# Patient Record
Sex: Male | Born: 1950
Health system: Southern US, Community
[De-identification: ages and names within clinical notes are randomized; demographics above are authoritative.]

## PROBLEM LIST (undated history)

## (undated) DIAGNOSIS — Z87898 Personal history of other specified conditions: Secondary | ICD-10-CM

## (undated) DIAGNOSIS — B019 Varicella without complication: Secondary | ICD-10-CM

## (undated) DIAGNOSIS — N400 Enlarged prostate without lower urinary tract symptoms: Secondary | ICD-10-CM

## (undated) DIAGNOSIS — K921 Melena: Secondary | ICD-10-CM

## (undated) DIAGNOSIS — M1990 Unspecified osteoarthritis, unspecified site: Secondary | ICD-10-CM

## (undated) DIAGNOSIS — R7303 Prediabetes: Secondary | ICD-10-CM

## (undated) DIAGNOSIS — N138 Other obstructive and reflux uropathy: Secondary | ICD-10-CM

## (undated) DIAGNOSIS — I10 Essential (primary) hypertension: Secondary | ICD-10-CM

## (undated) DIAGNOSIS — Z972 Presence of dental prosthetic device (complete) (partial): Secondary | ICD-10-CM

## (undated) DIAGNOSIS — E782 Mixed hyperlipidemia: Secondary | ICD-10-CM

## (undated) HISTORY — DX: Varicella without complication: B01.9

## (undated) HISTORY — DX: Benign prostatic hyperplasia without lower urinary tract symptoms: N40.0

## (undated) HISTORY — DX: Melena: K92.1

---

## 1999-05-31 ENCOUNTER — Other Ambulatory Visit: Admission: RE | Admit: 1999-05-31 | Discharge: 1999-05-31 | Payer: Self-pay | Admitting: Urology

## 2003-08-16 DIAGNOSIS — Z87438 Personal history of other diseases of male genital organs: Secondary | ICD-10-CM

## 2003-08-16 HISTORY — DX: Personal history of other diseases of male genital organs: Z87.438

## 2003-11-14 HISTORY — PX: PROSTATE SURGERY: SHX751

## 2004-02-21 ENCOUNTER — Emergency Department (HOSPITAL_COMMUNITY): Admission: EM | Admit: 2004-02-21 | Discharge: 2004-02-21 | Payer: Self-pay

## 2004-04-21 ENCOUNTER — Encounter (INDEPENDENT_AMBULATORY_CARE_PROVIDER_SITE_OTHER): Payer: Self-pay | Admitting: *Deleted

## 2004-04-22 ENCOUNTER — Inpatient Hospital Stay (HOSPITAL_COMMUNITY): Admission: RE | Admit: 2004-04-22 | Discharge: 2004-04-24 | Payer: Self-pay | Admitting: Urology

## 2004-04-22 HISTORY — PX: PROSTATECTOMY: SHX69

## 2005-08-15 HISTORY — PX: PROSTATE SURGERY: SHX751

## 2006-03-15 ENCOUNTER — Ambulatory Visit: Payer: Self-pay | Admitting: Internal Medicine

## 2006-08-11 ENCOUNTER — Ambulatory Visit: Payer: Self-pay | Admitting: Internal Medicine

## 2006-08-14 ENCOUNTER — Ambulatory Visit: Payer: Self-pay | Admitting: Internal Medicine

## 2007-08-28 ENCOUNTER — Ambulatory Visit: Payer: Self-pay | Admitting: Internal Medicine

## 2007-08-28 DIAGNOSIS — M542 Cervicalgia: Secondary | ICD-10-CM | POA: Insufficient documentation

## 2010-12-31 NOTE — Op Note (Signed)
NAME:  John Stone, John Stone                         ACCOUNT NO.:  [id number]   MEDICAL RECORD NO.:  [id number]                   PATIENT TYPE:  INP   LOCATION:  0377                                 FACILITY:  [facility]   PHYSICIAN:  Sigmund I. Patsi Sears, M.D.         DATE OF BIRTH:  Oct 26, 1950   DATE OF PROCEDURE:  04/22/2004  DATE OF DISCHARGE:  04/24/2004                                 OPERATIVE REPORT   PREOPERATIVE DIAGNOSES:  1.  Benign prostatic hypertrophy.  2.  Bladder outlet obstruction.   POSTOPERATIVE DIAGNOSES:  1.  Benign prostatic hypertrophy.  2.  Bladder outlet obstruction.   PROCEDURE PERFORMED:  Open simple prostatectomy.   SURGEON:  Sigmund I. Patsi Sears, M.D.   RESIDENT SURGEON:  Thyra Breed, M.D.   ANESTHESIA:  General endotracheal.   ESTIMATED BLOOD LOSS:  500 cc.   DRAINS:  An 41 French Foley catheter to straight drain.  A 10 [place] Blake  to bulb suction.   COMPLICATIONS:  None.   INDICATIONS FOR PROCEDURE:  Mr. Lepkowski is a pleasant 60 year old male with a  history of BPH as well as chronic urinary retention.  He underwent a TUNA in  April, 2005, at which time transrectal ultrasound measured his gland at 100  cc.  Recently, he has returned because of continued problems of urinary  retention and is now self-catheterizing once a day with continued elevated  post-void residuals.  Given the fact that he has failed TUNA therapy in the  past, in addition to a 100 cc gland, we have recommended that Mr. Lanz  undergo a simple prostatectomy for definitive surgical management of his  chronic urinary retention.  Mr. Rorke understands the risks, benefits and  alternatives of the procedure, including bleeding, infection, damage to  adjacent structures, incontinence as well as mortality.  Informed consent  has been obtained.   PROCEDURE IN DETAIL:  Following identification by his arm bracelet, the  patient was brought to the operating room and placed in the supine  position.  Here, he received preoperative IV antibiotics and underwent successful  induction of general endotracheal anesthesia.  His lower abdomen was then  shaved, prepped with Betadine, and draped in the usual sterile fashion on  the field.  An 84 French Foley catheter was placed, and the bladder was  drained.  We created an infraumbilical midline incision from just below the  umbilicus to the level of the pubis.  The Bovie electrocautery was used to  carry the incision down through the subcutaneous tissue until the anterior  rectus fascia was encountered.  The Bovie was used to divide the rectus  fascia, exposing the two bellies of the rectus abdominus muscles.  These  were bluntly divided, and the midline was isolated.  The transversalis  fascia was entered, and both lateral aspects of the prostate were developed  bluntly.  We then placed the Bookwalter self-retaining retractor.  The  Stamey retractor blade for  the Omni-Tract was used to provide cephalad  traction on the bladder, allowing excellent exposure of the prostate.  The  pelvis was rather deep, and the prostate was located just beneath the pubis.  The superficial veins overlying the prostate and bladder were then divided  using Bovie electrocautery.  Using a sponge stick and a Kitner, we then  cleaned the capsule overlying the prostate.  Upon palpation, the prostate  did appear to seem like a 75-100 cc gland.  Once the capsule was adequately  exposed, we placed two stay sutures on either end of the capsule  horizontally of 2-0 Vicryl.  A long-handle knife was used to incise the  capsule.  Using Metzenbaum scissors, we then developed a circular plane  within the incision.  The surgeon's finger was then used to further develop  this plane surrounding the adenoma but staying within the surgical capsule.  We continued this maneuver in a circumferential manner until the entire  adenoma was free within the capsule.  Between the  surgeon's thumb and  forefinger, we then pinched the urethra from the apex of the prostate.  This  allowed the delivery of the entire adenoma from the capsule.  There was some  venous bleeding which continued following removal of the adenoma with the  capsule intact.  Two figure-of-eight 2-0 Vicryl sutures were then used at  the 5 and 8 o'clock positions of the prostatic urethra near the pedicle to  provide excellent hemostasis.  Once the bleeding was adequately controlled,  we then used 2-0 Vicryl in a running fashion to close the capsule and  reapproximate the bladder neck to the urethra.  Prior to closing the  capsule, we placed an 3 French Foley catheter and guided it into the  bladder, 15 cc of sterile fluids were used to fill the balloon.  The Foley  catheter was then irrigated, and the catheter was seen to be water-tight.  The urine was clear at this point.  We then copiously irrigated the wound.  There was no further evidence of active bleeding.  Through a separate stab  incision of the midline incision, a 10 French flat Blake drain was placed  surrounding the prostatic capsule.  One 1-0 PDS was then used in a running  fashion to close the fascia.  The subcutaneous tissues were then irrigated.  Marcaine 0.5% was used to inject the skin.  Stainless surgical clips were  used to reapproximate the skin.  A 3-0 nylon suture was then used to suture  the drain into place.  The incisions were cleaned and dried, and sterile  dressings applied.  All sponge, needle, and instrument counts were correct  x2.  Dr. Patsi Sears was present and participated in the entire procedure, as  he was the responsible surgeon.  Patient tolerated the procedure well, and  there were no complications.   DISPOSITION:  After awakening from general anesthesia, the patient was  transported to the post anesthesia care unit in stable condition.  From here, he was transferred to the floor for further postoperative  management.     Thyra Breed, MD                            Sigmund I. Patsi Sears, M.D.    EG/MEDQ  D:  04/27/2004  T:  04/27/2004  Job:  [id number]

## 2011-04-01 ENCOUNTER — Ambulatory Visit: Payer: Self-pay | Admitting: Internal Medicine

## 2011-04-01 ENCOUNTER — Encounter: Payer: Self-pay | Admitting: Internal Medicine

## 2011-04-04 ENCOUNTER — Ambulatory Visit: Payer: Self-pay | Admitting: Internal Medicine

## 2015-06-29 ENCOUNTER — Encounter (HOSPITAL_COMMUNITY): Payer: Self-pay | Admitting: Emergency Medicine

## 2015-06-29 ENCOUNTER — Emergency Department (HOSPITAL_COMMUNITY)
Admission: EM | Admit: 2015-06-29 | Discharge: 2015-06-29 | Disposition: A | Discharge status: Home / Self Care | Payer: No Typology Code available for payment source | Attending: Emergency Medicine | Admitting: Emergency Medicine

## 2015-06-29 DIAGNOSIS — M545 Low back pain, unspecified: Secondary | ICD-10-CM

## 2015-06-29 DIAGNOSIS — Y92410 Unspecified street and highway as the place of occurrence of the external cause: Secondary | ICD-10-CM | POA: Insufficient documentation

## 2015-06-29 DIAGNOSIS — S3992XA Unspecified injury of lower back, initial encounter: Secondary | ICD-10-CM | POA: Insufficient documentation

## 2015-06-29 DIAGNOSIS — Y998 Other external cause status: Secondary | ICD-10-CM | POA: Insufficient documentation

## 2015-06-29 DIAGNOSIS — I10 Essential (primary) hypertension: Secondary | ICD-10-CM | POA: Insufficient documentation

## 2015-06-29 DIAGNOSIS — Z79899 Other long term (current) drug therapy: Secondary | ICD-10-CM | POA: Diagnosis not present

## 2015-06-29 DIAGNOSIS — Y9389 Activity, other specified: Secondary | ICD-10-CM | POA: Diagnosis not present

## 2015-06-29 HISTORY — DX: Essential (primary) hypertension: I10

## 2015-06-29 NOTE — Discharge Instructions (Signed)
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
## 2015-06-29 NOTE — ED Notes (Signed)
Patient states that he was in the [facility] on Thursday 11/10. Patient was seen at an urgent care and prescribed a muscle relaxant and high blood pressure medication. Patient wants to be evaluated here because his back still hurts and wants to check out his blood pressure.

## 2015-10-26 ENCOUNTER — Encounter: Payer: Self-pay | Admitting: Adult Health

## 2015-10-26 ENCOUNTER — Ambulatory Visit (INDEPENDENT_AMBULATORY_CARE_PROVIDER_SITE_OTHER): Payer: Medicare Other | Admitting: Adult Health

## 2015-10-26 VITALS — BP 140/90 | Temp 98.2°F | Ht 64.0 in | Wt 192.6 lb

## 2015-10-26 DIAGNOSIS — K625 Hemorrhage of anus and rectum: Secondary | ICD-10-CM | POA: Diagnosis not present

## 2015-10-26 DIAGNOSIS — I10 Essential (primary) hypertension: Secondary | ICD-10-CM

## 2015-10-26 DIAGNOSIS — Z7189 Other specified counseling: Secondary | ICD-10-CM

## 2015-10-26 DIAGNOSIS — Z7689 Persons encountering health services in other specified circumstances: Secondary | ICD-10-CM

## 2015-10-26 NOTE — Progress Notes (Signed)
Patient presents to clinic today to establish care. He is a pleasant AA male who  has a past medical history of Hypertension; Blood in stool; Chicken pox; and BPH (benign prostatic hyperplasia).   Acute Concerns: Establish Care Rectal Bleeding - one month ago he had two days of BRBPR. He noticed when he wiped. Has not had any since and has not had this issue in the past. Denies any pain or itching.   Chronic Issues:  HTN - Slightly elevated today. Denies any symptom of hyper or hypotension.   Health Maintenance: Dental -- Every six months Vision -- Does not see Immunizations -- needs Pneumovax  Colonoscopy -- Never had  Diet: Does not eat healthy Exercise: Does not exercise.    Past Medical History  Diagnosis Date  . Hypertension   . Blood in stool   . Chicken pox     Past Surgical History  Procedure Laterality Date  . Prostate surgery  2007    No current outpatient prescriptions on file prior to visit.   No current facility-administered medications on file prior to visit.    No Known Allergies  Family History  Problem Relation Age of Onset  . Hypertension      parent  . Hypertension      grandparent     Social History   Social History  . Marital Status: Married    Spouse Name: N/A  . Number of Children: N/A  . Years of Education: N/A   Occupational History  . Not on file.   Social History Main Topics  . Smoking status: Never Smoker   . Smokeless tobacco: Not on file  . Alcohol Use: No  . Drug Use: No  . Sexual Activity: Not on file   Other Topics Concern  . Not on file   Social History Narrative   Married   Regular exercise- no    Review of Systems  Constitutional: Negative.   HENT: Negative.   Eyes: Negative.   Respiratory: Negative.   Cardiovascular: Negative.   Gastrointestinal: Negative.   Genitourinary: Negative.   Musculoskeletal: Negative.   Skin: Negative.   Neurological: Negative.   Endo/Heme/Allergies: Negative.      BP 140/90 mmHg  Temp(Src) 98.2 F (36.8 C) (Oral)  Ht 5\' 4"  (1.626 m)  Wt 192 lb 9.6 oz (87.363 kg)  BMI 33.04 kg/m2  Physical Exam  Constitutional: He is oriented to person, place, and time and well-developed, well-nourished, and in no distress. No distress.  HENT:  Head: Normocephalic and atraumatic.  Right Ear: External ear normal.  Left Ear: External ear normal.  Nose: Nose normal.  Mouth/Throat: Oropharynx is clear and moist. No oropharyngeal exudate.  Neck: Normal range of motion. Neck supple. No thyromegaly present.  Cardiovascular: Normal rate, regular rhythm, normal heart sounds and intact distal pulses.  Exam reveals no gallop and no friction rub.   No murmur heard. Pulmonary/Chest: Effort normal and breath sounds normal. No respiratory distress. He has no wheezes. He has no rales. He exhibits no tenderness.  Genitourinary: Rectal exam shows external hemorrhoid. Rectal exam shows no internal hemorrhoid, no fissure, no laceration and no tenderness. Guaiac negative stool.  Lymphadenopathy:    He has no cervical adenopathy.  Neurological: He is alert and oriented to person, place, and time. Gait normal. GCS score is 15.  Skin: Skin is warm and dry. No rash noted. He is not diaphoretic. No erythema. No pallor.  Psychiatric: Mood, memory, affect and judgment normal.  Vitals reviewed.    Assessment/Plan:  1. Encounter to establish care - Follow up for CPE - Work on diet and exercise -   2. Essential hypertension - Will recheck at CPE.  - Adjust medication as needed   3. Rectal bleeding - Likely from hemorrhoid.  - advised better diet and water intake

## 2015-10-26 NOTE — Progress Notes (Signed)
Pre visit review using our clinic review tool, if applicable. No additional management support is needed unless otherwise documented below in the visit note. 

## 2015-10-26 NOTE — Patient Instructions (Signed)
It was great meeting you today   Follow up with me for your physical.   Someone will call you top schedule your colonoscopy.   If you need anything in the meantime, please let me know.

## 2016-01-25 ENCOUNTER — Encounter: Payer: Medicare Other | Admitting: Adult Health

## 2016-03-15 ENCOUNTER — Encounter: Payer: Self-pay | Admitting: Internal Medicine

## 2016-03-15 ENCOUNTER — Ambulatory Visit (INDEPENDENT_AMBULATORY_CARE_PROVIDER_SITE_OTHER): Payer: Medicare Other | Admitting: Adult Health

## 2016-03-15 ENCOUNTER — Encounter: Payer: Self-pay | Admitting: Adult Health

## 2016-03-15 VITALS — BP 154/82 | Temp 98.3°F | Ht 64.0 in | Wt 185.3 lb

## 2016-03-15 DIAGNOSIS — N400 Enlarged prostate without lower urinary tract symptoms: Secondary | ICD-10-CM | POA: Insufficient documentation

## 2016-03-15 DIAGNOSIS — E669 Obesity, unspecified: Secondary | ICD-10-CM

## 2016-03-15 DIAGNOSIS — I10 Essential (primary) hypertension: Secondary | ICD-10-CM | POA: Diagnosis not present

## 2016-03-15 LAB — CBC WITH DIFFERENTIAL/PLATELET
BASOS PCT: 0.5 % (ref 0.0–3.0)
Basophils Absolute: 0 10*3/uL (ref 0.0–0.1)
EOS PCT: 1.5 % (ref 0.0–5.0)
Eosinophils Absolute: 0.1 10*3/uL (ref 0.0–0.7)
HEMATOCRIT: 44.8 % (ref 39.0–52.0)
HEMOGLOBIN: 14.8 g/dL (ref 13.0–17.0)
LYMPHS PCT: 24.2 % (ref 12.0–46.0)
Lymphs Abs: 1.9 10*3/uL (ref 0.7–4.0)
MCHC: 33.1 g/dL (ref 30.0–36.0)
MCV: 84.2 fl (ref 78.0–100.0)
Monocytes Absolute: 0.8 10*3/uL (ref 0.1–1.0)
Monocytes Relative: 9.9 % (ref 3.0–12.0)
NEUTROS ABS: 5.2 10*3/uL (ref 1.4–7.7)
Neutrophils Relative %: 63.9 % (ref 43.0–77.0)
Platelets: 204 10*3/uL (ref 150.0–400.0)
RBC: 5.32 Mil/uL (ref 4.22–5.81)
RDW: 14.7 % (ref 11.5–15.5)
WBC: 8.1 10*3/uL (ref 4.0–10.5)

## 2016-03-15 LAB — POC URINALSYSI DIPSTICK (AUTOMATED)
BILIRUBIN UA: NEGATIVE
Glucose, UA: NEGATIVE
Ketones, UA: NEGATIVE
LEUKOCYTES UA: NEGATIVE
NITRITE UA: NEGATIVE
PH UA: 6
PROTEIN UA: NEGATIVE
RBC UA: NEGATIVE
Spec Grav, UA: 1.025
Urobilinogen, UA: 0.2

## 2016-03-15 LAB — PSA: PSA: 8.28 ng/mL — ABNORMAL HIGH (ref 0.10–4.00)

## 2016-03-15 LAB — BASIC METABOLIC PANEL
BUN: 19 mg/dL (ref 6–23)
CO2: 30 mEq/L (ref 19–32)
CREATININE: 1.16 mg/dL (ref 0.40–1.50)
Calcium: 9.8 mg/dL (ref 8.4–10.5)
Chloride: 100 mEq/L (ref 96–112)
GFR: 81.01 mL/min (ref >60.00)
Glucose, Bld: 80 mg/dL (ref 70–99)
POTASSIUM: 3.7 meq/L (ref 3.5–5.1)
Sodium: 138 mEq/L (ref 135–145)

## 2016-03-15 LAB — HEPATIC FUNCTION PANEL
ALBUMIN: 4.3 g/dL (ref 3.5–5.2)
ALT: 14 U/L (ref 0–53)
AST: 15 U/L (ref 0–37)
Alkaline Phosphatase: 52 U/L (ref 39–117)
Bilirubin, Direct: 0.1 mg/dL (ref 0.0–0.3)
TOTAL PROTEIN: 7.6 g/dL (ref 6.0–8.3)
Total Bilirubin: 0.7 mg/dL (ref 0.2–1.2)

## 2016-03-15 LAB — LIPID PANEL
CHOLESTEROL: 186 mg/dL (ref 0–200)
HDL: 52.9 mg/dL (ref >39.00)
LDL Cholesterol: 120 mg/dL — ABNORMAL HIGH (ref 0–99)
NonHDL: 133.5
TRIGLYCERIDES: 66 mg/dL (ref 0.0–149.0)
Total CHOL/HDL Ratio: 4
VLDL: 13.2 mg/dL (ref 0.0–40.0)

## 2016-03-15 LAB — HEMOGLOBIN A1C: HEMOGLOBIN A1C: 5.8 % (ref 4.6–6.5)

## 2016-03-15 LAB — TSH: TSH: 1.2 u[IU]/mL (ref 0.35–4.50)

## 2016-03-15 MED ORDER — LISINOPRIL-HYDROCHLOROTHIAZIDE 20-25 MG PO TABS: 1.0000 | ORAL_TABLET | Freq: Every day | ORAL | 3 refills | Status: DC

## 2016-03-15 NOTE — Patient Instructions (Addendum)
It was great seeing you today.   I will follow up with you on your labs.   Please work on diet and exercise in order to lose weight and lead a healthy life.   I have changed your blood pressure medication and added a medication called Lisinopril to it.   I would like you to follow up with me in one month so that we can check your blood pressure  Start taking a daily 81mg  aspirin   Health Maintenance, Male A healthy lifestyle and preventative care can promote health and wellness.  Maintain regular health, dental, and eye exams.  Eat a healthy diet. Foods like vegetables, fruits, whole grains, low-fat dairy products, and lean protein foods contain the nutrients you need and are low in calories. Decrease your intake of foods high in solid fats, added sugars, and salt. Get information about a proper diet from your health care provider, if necessary.  Regular physical exercise is one of the most important things you can do for your health. Most adults should get at least 150 minutes of moderate-intensity exercise (any activity that increases your heart rate and causes you to sweat) each week. In addition, most adults need muscle-strengthening exercises on 2 or more days a week.   Maintain a healthy weight. The body mass index (BMI) is a screening tool to identify possible weight problems. It provides an estimate of body fat based on height and weight. Your health care provider can find your BMI and can help you achieve or maintain a healthy weight. For males 20 years and older:  A BMI below 18.5 is considered underweight.  A BMI of 18.5 to 24.9 is normal.  A BMI of 25 to 29.9 is considered overweight.  A BMI of 30 and above is considered obese.  Maintain normal blood lipids and cholesterol by exercising and minimizing your intake of saturated fat. Eat a balanced diet with plenty of fruits and vegetables. Blood tests for lipids and cholesterol should begin at age 75 and be repeated every 5  years. If your lipid or cholesterol levels are high, you are over age 21, or you are at high risk for heart disease, you may need your cholesterol levels checked more frequently.Ongoing high lipid and cholesterol levels should be treated with medicines if diet and exercise are not working.  If you smoke, find out from your health care provider how to quit. If you do not use tobacco, do not start.  Lung cancer screening is recommended for adults aged 46-80 years who are at high risk for developing lung cancer because of a history of smoking. A yearly low-dose CT scan of the lungs is recommended for people who have at least a 30-pack-year history of smoking and are current smokers or have quit within the past 15 years. A pack year of smoking is smoking an average of 1 pack of cigarettes a day for 1 year (for example, a 30-pack-year history of smoking could mean smoking 1 pack a day for 30 years or 2 packs a day for 15 years). Yearly screening should continue until the smoker has stopped smoking for at least 15 years. Yearly screening should be stopped for people who develop a health problem that would prevent them from having lung cancer treatment.  If you choose to drink alcohol, do not have more than 2 drinks per day. One drink is considered to be 12 oz (360 mL) of beer, 5 oz (150 mL) of wine, or 1.5 oz (45  mL) of liquor.  Avoid the use of street drugs. Do not share needles with anyone. Ask for help if you need support or instructions about stopping the use of drugs.  High blood pressure causes heart disease and increases the risk of stroke. High blood pressure is more likely to develop in:  People who have blood pressure in the end of the normal range (100-139/85-89 mm Hg).  People who are overweight or obese.  People who are African American.  If you are 50-8 years of age, have your blood pressure checked every 3-5 years. If you are 62 years of age or older, have your blood pressure checked  every year. You should have your blood pressure measured twice--once when you are at a hospital or clinic, and once when you are not at a hospital or clinic. Record the average of the two measurements. To check your blood pressure when you are not at a hospital or clinic, you can use:  An automated blood pressure machine at a pharmacy.  A home blood pressure monitor.  If you are 11-6 years old, ask your health care provider if you should take aspirin to prevent heart disease.  Diabetes screening involves taking a blood sample to check your fasting blood sugar level. This should be done once every 3 years after age 74 if you are at a normal weight and without risk factors for diabetes. Testing should be considered at a younger age or be carried out more frequently if you are overweight and have at least 1 risk factor for diabetes.  Colorectal cancer can be detected and often prevented. Most routine colorectal cancer screening begins at the age of 77 and continues through age 76. However, your health care provider may recommend screening at an earlier age if you have risk factors for colon cancer. On a yearly basis, your health care provider may provide home test kits to check for hidden blood in the stool. A small camera at the end of a tube may be used to directly examine the colon (sigmoidoscopy or colonoscopy) to detect the earliest forms of colorectal cancer. Talk to your health care provider about this at age 8 when routine screening begins. A direct exam of the colon should be repeated every 5-10 years through age 46, unless early forms of precancerous polyps or small growths are found.  People who are at an increased risk for hepatitis B should be screened for this virus. You are considered at high risk for hepatitis B if:  You were born in a country where hepatitis B occurs often. Talk with your health care provider about which countries are considered high risk.  Your parents were born in a  high-risk country and you have not received a shot to protect against hepatitis B (hepatitis B vaccine).  You have HIV or AIDS.  You use needles to inject street drugs.  You live with, or have sex with, someone who has hepatitis B.  You are a man who has sex with other men (MSM).  You get hemodialysis treatment.  You take certain medicines for conditions like cancer, organ transplantation, and autoimmune conditions.  Hepatitis C blood testing is recommended for all people born from [birth year] through 1966 and any individual with known risk factors for hepatitis C.  Healthy men should no longer receive prostate-specific antigen (PSA) blood tests as part of routine cancer screening. Talk to your health care provider about prostate cancer screening.  Testicular cancer screening is not recommended for adolescents  or adult males who have no symptoms. Screening includes self-exam, a health care provider exam, and other screening tests. Consult with your health care provider about any symptoms you have or any concerns you have about testicular cancer.  Practice safe sex. Use condoms and avoid high-risk sexual practices to reduce the spread of sexually transmitted infections (STIs).  You should be screened for STIs, including gonorrhea and chlamydia if:  You are sexually active and are younger than 24 years.  You are older than 24 years, and your health care provider tells you that you are at risk for this type of infection.  Your sexual activity has changed since you were last screened, and you are at an increased risk for chlamydia or gonorrhea. Ask your health care provider if you are at risk.  If you are at risk of being infected with HIV, it is recommended that you take a prescription medicine daily to prevent HIV infection. This is called pre-exposure prophylaxis (PrEP). You are considered at risk if:  You are a man who has sex with other men (MSM).  You are a heterosexual man who is  sexually active with multiple partners.  You take drugs by injection.  You are sexually active with a partner who has HIV.  Talk with your health care provider about whether you are at high risk of being infected with HIV. If you choose to begin PrEP, you should first be tested for HIV. You should then be tested every 3 months for as long as you are taking PrEP.  Use sunscreen. Apply sunscreen liberally and repeatedly throughout the day. You should seek shade when your shadow is shorter than you. Protect yourself by wearing long sleeves, pants, a wide-brimmed hat, and sunglasses year round whenever you are outdoors.  Tell your health care provider of new moles or changes in moles, especially if there is a change in shape or color. Also, tell your health care provider if a mole is larger than the size of a pencil eraser.  A one-time screening for abdominal aortic aneurysm (AAA) and surgical repair of large AAAs by ultrasound is recommended for men aged 92-75 years who are current or former smokers.  Stay current with your vaccines (immunizations).   This information is not intended to replace advice given to you by your health care provider. Make sure you discuss any questions you have with your health care provider.   Document Released: 01/28/2008 Document Revised: 08/22/2014 Document Reviewed: 12/27/2010 Elsevier Interactive Patient Education [employer].

## 2016-03-15 NOTE — Progress Notes (Signed)
Subjective:    Patient ID: John Stone, male    DOB: [birth year], 65 y.o.   MRN: [id number]  HPI  Patient presents for yearly preventative medicine examination due to history of hypertension, BPH and obesity.   All immunizations and health maintenance protocols were reviewed with the patient and needed orders were placed. He does not want Pneumonia or Tdap   Appropriate screening laboratory values were ordered for the patient including screening of hyperlipidemia, renal function and hepatic function. If indicated by BPH, a PSA was ordered.  Medication reconciliation,  past medical history, social history, problem list and allergies were reviewed in detail with the patient  Goals were established with regard to weight loss, exercise, and  diet in compliance with medications. He is not exercising and is not following a diet.  He is not up to date on his colonoscopy - he has never had one. He has not seen his eye doctor this year nor seen his dentist.    He has no acute complaints today    Review of Systems  Constitutional: Negative.   HENT: Negative.   Eyes: Negative.   Respiratory: Negative.   Cardiovascular: Negative.   Gastrointestinal: Negative.   Endocrine: Negative.   Genitourinary: Negative.   Musculoskeletal: Negative.   Skin: Negative.   Allergic/Immunologic: Negative.   Neurological: Negative.   Hematological: Negative.   Psychiatric/Behavioral: Negative.   All other systems reviewed and are negative.  Past Medical History:  Diagnosis Date  . Blood in stool   . BPH (benign prostatic hyperplasia)   . Chicken pox   . Hypertension     Social History   Social History  . Marital status: Married    Spouse name: N/A  . Number of children: N/A  . Years of education: N/A   Occupational History  . Not on file.   Social History Main Topics  . Smoking status: Never Smoker  . Smokeless tobacco: Not on file  . Alcohol use No  . Drug use: No  . Sexual  activity: Not on file   Other Topics Concern  . Not on file   Social History Narrative   Married   8 kids    6 live locally.       Work in [employer]   He likes to stay active in [employer].     Past Surgical History:  Procedure Laterality Date  . PROSTATE SURGERY  2007    Family History  Problem Relation Age of Onset  . Hypertension Mother     parent  . Hypertension Maternal Grandfather     grandparent     No Known Allergies  Current Outpatient Prescriptions on File Prior to Visit  Medication Sig Dispense Refill  . hydrochlorothiazide (HYDRODIURIL) 25 MG tablet TK 1 T PO QD AS DIRECTED  3   No current facility-administered medications on file prior to visit.     BP (!) 154/82   Temp 98.3 F (36.8 C) (Oral)   Ht 5\' 4"  (1.626 m)   Wt 185 lb 4.8 oz (84.1 kg)   BMI 31.81 kg/m       Objective:   Physical Exam  Constitutional: He is oriented to person, place, and time. He appears well-developed and well-nourished. No distress.  Slightly obese   HENT:  Head: Normocephalic and atraumatic.  Right Ear: External ear normal.  Left Ear: External ear normal.  Nose: Nose normal.  Mouth/Throat: Oropharynx is clear and moist. No oropharyngeal exudate.  Eyes: Conjunctivae and EOM are normal. Pupils are equal, round, and reactive to light. Right eye exhibits no discharge. Left eye exhibits no discharge. No scleral icterus.  Neck: No JVD present. Carotid bruit is not present. No tracheal deviation present. No thyromegaly present.  Cardiovascular: Normal rate, regular rhythm, normal heart sounds and intact distal pulses.  Exam reveals no gallop and no friction rub.   No murmur heard. Pulmonary/Chest: Effort normal and breath sounds normal. No respiratory distress. He has no wheezes. He has no rales. He exhibits no tenderness.  Abdominal: Soft. Bowel sounds are normal. He exhibits no distension and no mass. There is no tenderness. There is no rebound and no guarding.    Musculoskeletal: Normal range of motion. He exhibits no edema, tenderness or deformity.  Lymphadenopathy:    He has no cervical adenopathy.  Neurological: He is alert and oriented to person, place, and time. He has normal reflexes. He displays normal reflexes. No cranial nerve deficit. He exhibits normal muscle tone. Coordination normal.  Skin: Skin is warm and dry. No rash noted. He is not diaphoretic. No erythema. No pallor.  Psychiatric: He has a normal mood and affect. His behavior is normal. Judgment and thought content normal.  Nursing note and vitals reviewed.     Assessment & Plan:  1. Essential hypertension - Not at goal. Will add lisinopril 20 mg to regimen  - EKG 12-Lead- Sinus Loletha Grayer, left atrial enlargement ( due to hypertension), rate 53 - POCT Urinalysis Dipstick (Automated) - Basic metabolic panel - Lipid panel - CBC with Differential/Platelet - Hepatic function panel - TSH - lisinopril-hydrochlorothiazide (PRINZIDE,ZESTORETIC) 20-25 MG tablet; Take 1 tablet by mouth daily.  Dispense: 90 tablet; Refill: 3 - Hemoglobin A1C - Ambulatory referral to Gastroenterology - PSA - educated on the importance of diet and exercise - Recommended a daily baby aspirin  - Follow up in one month for recheck of BP - Monitor at home 2. BPH (benign prostatic hyperplasia) - PSA  3. Obesity - Needs to exercise and lose weight  - POCT Urinalysis Dipstick (Automated) - Basic metabolic panel - Lipid panel - CBC with Differential/Platelet - Hepatic function panel - TSH - lisinopril-hydrochlorothiazide (PRINZIDE,ZESTORETIC) 20-25 MG tablet; Take 1 tablet by mouth daily.  Dispense: 90 tablet; Refill: 3 - Hemoglobin A1C - Ambulatory referral to Gastroenterology - PSA  Dorothyann Peng, NP

## 2016-03-16 ENCOUNTER — Telehealth: Payer: Self-pay | Admitting: Adult Health

## 2016-03-16 DIAGNOSIS — R972 Elevated prostate specific antigen [PSA]: Secondary | ICD-10-CM

## 2016-03-16 NOTE — Telephone Encounter (Signed)
Spoke to John Stone on the phone and informed him of his labs. His PSA is elevated at 8.28. He has had surgery in the past on his prostate for " enlarged prostate" He denies prostate cancer.   He is unsure of what his prior PSA's were.   I will refer back to Urology

## 2016-04-01 ENCOUNTER — Ambulatory Visit (AMBULATORY_SURGERY_CENTER): Payer: Self-pay | Admitting: *Deleted

## 2016-04-01 VITALS — Ht 64.0 in | Wt 187.8 lb

## 2016-04-01 DIAGNOSIS — Z1211 Encounter for screening for malignant neoplasm of colon: Secondary | ICD-10-CM

## 2016-04-01 NOTE — Progress Notes (Signed)
No egg or soy allergy known to patient  No issues with past sedation with any surgeries  or procedures, no intubation problems  No diet pills per patient No home 02 use per patient  No blood thinners per patient  Pt denies issues with constipation  No A fib or A flutter   

## 2016-04-13 ENCOUNTER — Ambulatory Visit (INDEPENDENT_AMBULATORY_CARE_PROVIDER_SITE_OTHER): Payer: Medicare Other | Admitting: Adult Health

## 2016-04-13 ENCOUNTER — Encounter: Payer: Self-pay | Admitting: Adult Health

## 2016-04-13 VITALS — BP 140/80 | Temp 98.1°F | Ht 64.0 in | Wt 185.4 lb

## 2016-04-13 DIAGNOSIS — I10 Essential (primary) hypertension: Secondary | ICD-10-CM

## 2016-04-13 NOTE — Progress Notes (Signed)
Subjective:    Patient ID: John Stone, male    DOB: 1951-02-28, 65 y.o.   MRN: [id number]  HPI  65 year old male who presents to the office for one month follow up regarding hypertension. When I last saw him his BP was not at goal and he was taking 25 mg HCTZ. 20 mg of lisinopril was added to his regimen.   Today in the office his blood pressure is 140/80. He had taken his medication about 20 minutes before showing up to his appointment.   He denies checking his blood pressure at home. He denies any headaches or blurred vision.    Review of Systems  Constitutional: Negative.   HENT: Negative.   Eyes: Negative.   Respiratory: Negative.   Cardiovascular: Negative.   Gastrointestinal: Negative.   Endocrine: Negative.   Genitourinary: Negative.   Musculoskeletal: Negative.   Neurological: Negative.   Hematological: Negative.   All other systems reviewed and are negative.  Past Medical History:  Diagnosis Date  . Blood in stool   . BPH (benign prostatic hyperplasia)   . Chicken pox   . Hypertension     Social History   Social History  . Marital status: Married    Spouse name: N/A  . Number of children: N/A  . Years of education: N/A   Occupational History  . Not on file.   Social History Main Topics  . Smoking status: Never Smoker  . Smokeless tobacco: Never Used  . Alcohol use No  . Drug use: No  . Sexual activity: Not on file   Other Topics Concern  . Not on file   Social History Narrative   Married   8 kids    6 live locally.       Work in [employer]   He likes to stay active in [employer].     Past Surgical History:  Procedure Laterality Date  . PROSTATE SURGERY  2007    Family History  Problem Relation Age of Onset  . Hypertension Mother     parent  . Hypertension Maternal Grandfather     grandparent   . Colon cancer Neg Hx   . Colon polyps Neg Hx   . Esophageal cancer Neg Hx   . Rectal cancer Neg Hx   . Stomach  cancer Neg Hx     No Known Allergies  Current Outpatient Prescriptions on File Prior to Visit  Medication Sig Dispense Refill  . aspirin 81 MG chewable tablet Chew 81 mg by mouth daily.    . bisacodyl (DULCOLAX) 5 MG EC tablet Take 5 mg by mouth once. X 4 colon 9-1    . lisinopril-hydrochlorothiazide (PRINZIDE,ZESTORETIC) 20-25 MG tablet Take 1 tablet by mouth daily. 90 tablet 3  . polyethylene glycol powder (MIRALAX) powder Take 1 Container by mouth once. 238 grams for colon 9-1     No current facility-administered medications on file prior to visit.     BP 140/80   Temp 98.1 F (36.7 C) (Oral)   Ht 5\' 4"  (1.626 m)   Wt 185 lb 6.4 oz (84.1 kg)   BMI 31.82 kg/m       Objective:   Physical Exam  Constitutional: He is oriented to person, place, and time. He appears well-developed and well-nourished. No distress.  HENT:  Head: Normocephalic and atraumatic.  Right Ear: External ear normal.  Mouth/Throat: Oropharynx is clear and moist.  Cardiovascular: Normal rate, regular rhythm, normal heart sounds and  intact distal pulses.  Exam reveals no gallop and no friction rub.   No murmur heard. Pulmonary/Chest: Effort normal and breath sounds normal. No respiratory distress. He has no wheezes. He has no rales. He exhibits no tenderness.  Abdominal: Soft. Bowel sounds are normal.  Musculoskeletal: Normal range of motion. He exhibits no edema, tenderness or deformity.  Neurological: He is alert and oriented to person, place, and time. He has normal reflexes.  Skin: Skin is warm and dry. No rash noted. He is not diaphoretic. No erythema. No pallor.  Psychiatric: He has a normal mood and affect. His behavior is normal. Judgment and thought content normal.  Nursing note and vitals reviewed.     Assessment & Plan:  1. Essential hypertension - Continue with current dose.  - Check BP at home and send me the results via MyChart - Work on diet and exercise - Follow up as needed  Dorothyann Peng, NP

## 2016-04-13 NOTE — Patient Instructions (Signed)
It was great seeing you again   Please monitor your blood pressure at home and let me know what they have been over the next 2 weeks.   Follow up as needed

## 2016-04-15 ENCOUNTER — Encounter: Payer: Medicare Other | Admitting: Internal Medicine

## 2016-05-23 DIAGNOSIS — R972 Elevated prostate specific antigen [PSA]: Secondary | ICD-10-CM | POA: Diagnosis not present

## 2016-05-23 DIAGNOSIS — N400 Enlarged prostate without lower urinary tract symptoms: Secondary | ICD-10-CM | POA: Diagnosis not present

## 2016-06-27 ENCOUNTER — Encounter (HOSPITAL_COMMUNITY): Payer: Self-pay | Admitting: *Deleted

## 2016-06-27 ENCOUNTER — Emergency Department (HOSPITAL_COMMUNITY)
Admission: EM | Admit: 2016-06-27 | Discharge: 2016-06-27 | Disposition: A | Discharge status: Home / Self Care | Payer: Medicare Other | Attending: Emergency Medicine | Admitting: Emergency Medicine

## 2016-06-27 DIAGNOSIS — Z7982 Long term (current) use of aspirin: Secondary | ICD-10-CM | POA: Diagnosis not present

## 2016-06-27 DIAGNOSIS — I10 Essential (primary) hypertension: Secondary | ICD-10-CM | POA: Insufficient documentation

## 2016-06-27 DIAGNOSIS — Z79899 Other long term (current) drug therapy: Secondary | ICD-10-CM | POA: Diagnosis not present

## 2016-06-27 NOTE — ED Triage Notes (Signed)
Pt states his daughter took his blood pressure at home, which read 200/111. Pt denies dizziness, blurred vision, headache, chest pain. Pt is on blood pressure medication.

## 2016-06-27 NOTE — ED Provider Notes (Signed)
[facility] DEPT Provider Note   CSN: [id number] Arrival date & time: 06/27/16  1513  By signing my name below, I, Rayna Sexton, attest that this documentation has been prepared under the direction and in the presence of Chardai Gangemi Camprubi-Soms, PA-C. Electronically Signed: Rayna Sexton, ED Scribe. 06/27/16. 4:08 PM.   History   Chief Complaint Chief Complaint  Patient presents with  . Hypertension    HPI HPI Comments: John Stone is a 65 y.o. male with a PMHx of HTN, who presents to the Emergency Department for a BP check. States that his mother had a bad dream about him having HTN, and his sister checked it and found it to be 200/111 so he came to the ER for eval. BP 188/100 mmHg in triage. Pt was on hctz 25 mg and his physician added lisinopril 20 mg on 03/15/2016. He was seen by his PCP again on 04/13/2016 and BP was at goal (140/80s) so he was instructed to monitor his BP and f/u in two weeks. Pt admits that he has not monitored his BP and has not followed up with his PCP. He ate "four pieces of fried chicken yesterday" and regularly eats salty foods, so he thinks this could be contributing to his BP today. Pt took his HTN medication this morning at ~10:00 AM. He denies any symptoms, including fevers, chills, CP, SOB, leg swelling, HA, lightheadedness, blurred vision, abdominal pain, n/v/d/c, dysuria, hematuria, numbness, tingling and weakness. No other complaints at this time. Denies any caffeine intake.   The history is provided by the patient and medical records. No language interpreter was used.  Hypertension  This is a chronic problem. The current episode started more than 1 week ago. The problem occurs constantly. The problem has not changed since onset.Pertinent negatives include no chest pain, no abdominal pain, no headaches and no shortness of breath. Nothing aggravates the symptoms. Nothing relieves the symptoms. Treatments tried: hctz and lisinopril  The treatment  provided mild relief.   Past Medical History:  Diagnosis Date  . Blood in stool   . BPH (benign prostatic hyperplasia)   . Chicken pox   . Hypertension     Patient Active Problem List   Diagnosis Date Noted  . BPH (benign prostatic hyperplasia) 03/15/2016  . Essential hypertension 10/26/2015  . NECK PAIN 08/28/2007    Past Surgical History:  Procedure Laterality Date  . PROSTATE SURGERY  2007       Home Medications    Prior to Admission medications   Medication Sig Start Date End Date Taking? Authorizing Provider  aspirin 81 MG chewable tablet Chew 81 mg by mouth daily.    Historical Provider, MD  bisacodyl (DULCOLAX) 5 MG EC tablet Take 5 mg by mouth once. X 4 colon 9-1    Historical Provider, MD  lisinopril-hydrochlorothiazide (PRINZIDE,ZESTORETIC) 20-25 MG tablet Take 1 tablet by mouth daily. 03/15/16   Dorothyann Peng, NP  polyethylene glycol powder (MIRALAX) powder Take 1 Container by mouth once. 238 grams for colon 9-1    Historical Provider, MD    Family History Family History  Problem Relation Age of Onset  . Hypertension Mother     parent  . Hypertension Maternal Grandfather     grandparent   . Colon cancer Neg Hx   . Colon polyps Neg Hx   . Esophageal cancer Neg Hx   . Rectal cancer Neg Hx   . Stomach cancer Neg Hx     Social History Social History  Substance  Use Topics  . Smoking status: Never Smoker  . Smokeless tobacco: Never Used  . Alcohol use No     Allergies   Patient has no known allergies.   Review of Systems Review of Systems  Constitutional: Negative for chills and fever.       +HTN  Eyes: Negative for visual disturbance.  Respiratory: Negative for shortness of breath.   Cardiovascular: Negative for chest pain and leg swelling.  Gastrointestinal: Negative for abdominal pain, constipation, diarrhea, nausea and vomiting.  Genitourinary: Negative for dysuria and hematuria.  Musculoskeletal: Negative for arthralgias and myalgias.    Skin: Negative for color change.  Allergic/Immunologic: Negative for immunocompromised state.  Neurological: Negative for dizziness, weakness, light-headedness, numbness and headaches.  Psychiatric/Behavioral: Negative for confusion.   A complete 10 system review of systems was obtained and all systems are negative except as noted in the HPI and PMH.   Physical Exam Updated Vital Signs BP 188/100 (BP Location: Left Arm)   Pulse 66   Temp 97.8 F (36.6 C) (Oral)   Resp 18   SpO2 100%   Physical Exam  Constitutional: He is oriented to person, place, and time. Vital signs are normal. He appears well-developed and well-nourished.  Non-toxic appearance. No distress.  Afebrile, nontoxic, NAD, mild HTN 188/100 mmHg  HENT:  Head: Normocephalic and atraumatic.  Mouth/Throat: Oropharynx is clear and moist and mucous membranes are normal.  Eyes: Conjunctivae and EOM are normal. Right eye exhibits no discharge. Left eye exhibits no discharge.  Neck: Normal range of motion. Neck supple.  Cardiovascular: Normal rate, regular rhythm, normal heart sounds and intact distal pulses.  Exam reveals no gallop and no friction rub.   No murmur heard. Pulmonary/Chest: Effort normal and breath sounds normal. No respiratory distress. He has no decreased breath sounds. He has no wheezes. He has no rhonchi. He has no rales.  CTAB in all lung fields, no w/r/r, no hypoxia or increased WOB, speaking in full sentences, SpO2 100% on RA   Abdominal: Soft. Normal appearance and bowel sounds are normal. He exhibits no distension. There is no tenderness. There is no rigidity, no rebound, no guarding, no CVA tenderness, no tenderness at McBurney's point and negative Murphy's sign.  Musculoskeletal: Normal range of motion.  MAE x4 Strength and sensation grossly intact in all extremities Distal pulses intact Gait steady No pedal edema, neg homan's bilaterally   Neurological: He is alert and oriented to person, place,  and time. He has normal strength. No sensory deficit.  Skin: Skin is warm, dry and intact. No rash noted.  Psychiatric: He has a normal mood and affect.  Nursing note and vitals reviewed.   ED Treatments / Results  Labs (all labs ordered are listed, but only abnormal results are displayed) Labs Reviewed - No data to display  EKG  EKG Interpretation None       Radiology No results found.  Procedures Procedures  DIAGNOSTIC STUDIES: Oxygen Saturation is 100% on RA, normal by my interpretation.    COORDINATION OF CARE: 4:06 PM Discussed next steps with pt. Pt verbalized understanding and is agreeable with the plan.    Medications Ordered in ED Medications - No data to display  Initial Impression / Assessment and Plan / ED Course  I have reviewed the triage vital signs and the nursing notes.  Pertinent labs & imaging results that were available during my care of the patient were reviewed by me and considered in my medical decision making (see chart  for details).  Clinical Course     65 y.o. male here for BP check. Completely asymptomatic at this time, states that his mother and sister urged him to come to the emergency room for evaluation of his blood pressure because it was noted to be 200/111 at home. Here he is hypertensive to the 188/100 range, but has no symptoms or physical exam findings. Doubt need for emergent workup. Discussed that he needs to keep a log of his blood pressure readings to follow-up with his regular doctor for ongoing management. Avoid salt intake, fatty or fried foods, and caffeine intake. DASH diet discussed. F/up with PCP in 1wk. I explained the diagnosis and have given explicit precautions to return to the ER including for any other new or worsening symptoms. The patient understands and accepts the medical plan as it's been dictated and I have answered their questions. Discharge instructions concerning home care and prescriptions have been given. The  patient is STABLE and is discharged to home in good condition.   I personally performed the services described in this documentation, which was scribed in my presence. The recorded information has been reviewed and is accurate.  Final Clinical Impressions(s) / ED Diagnoses   Final diagnoses:  HTN (hypertension), benign    New Prescriptions New Prescriptions   No medications on file     Zacarias Pontes, PA-C 06/27/16 [place], MD 07/04/16 [id number]

## 2016-06-27 NOTE — Discharge Instructions (Signed)
Your blood pressure is up, which can cause many issues if you don't take care of it. You should decrease the salt in your diet to help your blood pressure, eat fewer fatty and fried foods, avoid sodas/coffee/tea or any caffeine intake as this all may increase your blood pressure. Take your home blood pressure medication as directed by your doctor. Keep a log of your blood pressure readings every morning and night after a 10 minute relaxation period, and take the log to your doctor for review. Follow up with your primary care doctor in 1-2 weeks for ongoing management of your blood pressure. Return to the ER for changes or worsening symptoms

## 2016-08-22 DIAGNOSIS — R972 Elevated prostate specific antigen [PSA]: Secondary | ICD-10-CM | POA: Diagnosis not present

## 2016-08-23 ENCOUNTER — Encounter: Payer: Self-pay | Admitting: Adult Health

## 2016-08-23 ENCOUNTER — Ambulatory Visit (INDEPENDENT_AMBULATORY_CARE_PROVIDER_SITE_OTHER): Payer: Medicare Other | Admitting: Adult Health

## 2016-08-23 VITALS — BP 158/70 | Temp 98.1°F | Ht 64.0 in | Wt 191.4 lb

## 2016-08-23 DIAGNOSIS — I10 Essential (primary) hypertension: Secondary | ICD-10-CM

## 2016-08-23 DIAGNOSIS — R05 Cough: Secondary | ICD-10-CM | POA: Diagnosis not present

## 2016-08-23 DIAGNOSIS — R059 Cough, unspecified: Secondary | ICD-10-CM

## 2016-08-23 MED ORDER — LISINOPRIL 20 MG PO TABS: 20.0000 mg | ORAL_TABLET | Freq: Every day | ORAL | 1 refills | Status: DC

## 2016-08-23 MED ORDER — BENZONATATE 200 MG PO CAPS: 200.0000 mg | ORAL_CAPSULE | Freq: Three times a day (TID) | ORAL | 0 refills | Status: DC | PRN

## 2016-08-23 MED ORDER — FLUTICASONE PROPIONATE 50 MCG/ACT NA SUSP
2.0000 | Freq: Every day | NASAL | 6 refills | Status: DC
Start: 2016-08-23 — End: 2017-09-28

## 2016-08-23 NOTE — Progress Notes (Signed)
Subjective:    Patient ID: John Stone, male    DOB: 1950-10-19, 66 y.o.   MRN: [id number]  Cough  This is a new problem. The current episode started 1 to 4 weeks ago (2 weeks ). The problem has been waxing and waning. The cough is non-productive. Associated symptoms include postnasal drip. Pertinent negatives include no ear pain, nasal congestion, rhinorrhea, sore throat, shortness of breath or wheezing. The symptoms are aggravated by lying down. He has tried body position changes (mucinex) for the symptoms. The treatment provided mild relief.    His blood pressure is not at goal. He was recently seen in the ER for a BP check where is blood pressure was in the 99991111 systolic. He reports that he monitors his blood pressure at home on occasion and it has been in the A999333 systolic.   He denies any headaches, blurred vision or dizziness.   Review of Systems  Constitutional: Negative.   HENT: Positive for postnasal drip. Negative for congestion, ear pain, rhinorrhea, sinus pain, sinus pressure and sore throat.   Respiratory: Positive for cough. Negative for chest tightness, shortness of breath and wheezing.   Allergic/Immunologic: Positive for food allergies.      Past Medical History:  Diagnosis Date  . Blood in stool   . BPH (benign prostatic hyperplasia)   . Chicken pox   . Hypertension     Social History   Social History  . Marital status: Married    Spouse name: N/A  . Number of children: N/A  . Years of education: N/A   Occupational History  . Not on file.   Social History Main Topics  . Smoking status: Never Smoker  . Smokeless tobacco: Never Used  . Alcohol use No  . Drug use: No  . Sexual activity: Not on file   Other Topics Concern  . Not on file   Social History Narrative   Married   8 kids    6 live locally.       Work in [employer]   He likes to stay active in [employer].     Past Surgical History:  Procedure Laterality Date    . PROSTATE SURGERY  2007    Family History  Problem Relation Age of Onset  . Hypertension Mother     parent  . Hypertension Maternal Grandfather     grandparent   . Colon cancer Neg Hx   . Colon polyps Neg Hx   . Esophageal cancer Neg Hx   . Rectal cancer Neg Hx   . Stomach cancer Neg Hx     No Known Allergies  Current Outpatient Prescriptions on File Prior to Visit  Medication Sig Dispense Refill  . aspirin 81 MG chewable tablet Chew 81 mg by mouth daily.    . bisacodyl (DULCOLAX) 5 MG EC tablet Take 5 mg by mouth once. X 4 colon 9-1    . lisinopril-hydrochlorothiazide (PRINZIDE,ZESTORETIC) 20-25 MG tablet Take 1 tablet by mouth daily. 90 tablet 3  . polyethylene glycol powder (MIRALAX) powder Take 1 Container by mouth once. 238 grams for colon 9-1     No current facility-administered medications on file prior to visit.     BP (!) 158/70   Temp 98.1 F (36.7 C) (Oral)   Ht 5\' 4"  (1.626 m)   Wt 191 lb 6.4 oz (86.8 kg)   BMI 32.85 kg/m       Objective:   Physical Exam  Constitutional: He  is oriented to person, place, and time. He appears well-developed and well-nourished. No distress.  HENT:  Head: Normocephalic and atraumatic.  Right Ear: External ear normal.  Left Ear: External ear normal.  Nose: Nose normal.  Mouth/Throat: Oropharyngeal exudate present.  Neck: Normal range of motion.  Cardiovascular: Normal rate, regular rhythm, normal heart sounds and intact distal pulses.  Exam reveals no gallop and no friction rub.   No murmur heard. Pulmonary/Chest: Effort normal and breath sounds normal. No respiratory distress. He has no wheezes. He has no rales. He exhibits no tenderness.  Lymphadenopathy:    He has no cervical adenopathy.  Neurological: He is alert and oriented to person, place, and time.  Skin: Skin is warm and dry. No rash noted. He is not diaphoretic. No erythema. No pallor.  Psychiatric: He has a normal mood and affect. His behavior is normal.  Judgment and thought content normal.  Nursing note and vitals reviewed.     Assessment & Plan:  1. Cough - Appears to be from PND. Not concerned at this time for lisinopril induced cough. No concern for pneumonia or bronchitis.  - benzonatate (TESSALON) 200 MG capsule; Take 1 capsule (200 mg total) by mouth 3 (three) times daily as needed for cough.  Dispense: 20 capsule; Refill: 0 - fluticasone (FLONASE) 50 MCG/ACT nasal spray; Place 2 sprays into both nostrils daily.  Dispense: 16 g; Refill: 6  2. Essential hypertension - Will add lisinopril 20 mg  - lisinopril (PRINIVIL,ZESTRIL) 20 MG tablet; Take 1 tablet (20 mg total) by mouth daily.  Dispense: 30 tablet; Refill: 1 - Follow up in 2 weeks or sooner if needed  Dorothyann Peng, NP

## 2016-08-23 NOTE — Patient Instructions (Signed)
I have called in Tessalon pearls and Flonase to help with your cough.   I have also added 20 mg of lisinopril to your daily blood pressure regimen.   Please follow up in with me 2 weeks to check your blood pressure

## 2016-08-30 ENCOUNTER — Encounter: Payer: Self-pay | Admitting: Adult Health

## 2016-08-30 ENCOUNTER — Ambulatory Visit (INDEPENDENT_AMBULATORY_CARE_PROVIDER_SITE_OTHER)
Admission: RE | Admit: 2016-08-30 | Discharge: 2016-08-30 | Disposition: A | Discharge status: Home / Self Care | Payer: Medicare Other | Source: Ambulatory Visit | Attending: Adult Health | Admitting: Adult Health

## 2016-08-30 ENCOUNTER — Other Ambulatory Visit: Payer: Self-pay | Admitting: Adult Health

## 2016-08-30 ENCOUNTER — Ambulatory Visit (INDEPENDENT_AMBULATORY_CARE_PROVIDER_SITE_OTHER): Payer: Medicare Other | Admitting: Adult Health

## 2016-08-30 ENCOUNTER — Telehealth: Payer: Self-pay | Admitting: Adult Health

## 2016-08-30 VITALS — BP 152/90 | HR 99 | Temp 102.6°F | Wt 189.0 lb

## 2016-08-30 DIAGNOSIS — R6889 Other general symptoms and signs: Secondary | ICD-10-CM

## 2016-08-30 DIAGNOSIS — R0602 Shortness of breath: Secondary | ICD-10-CM | POA: Diagnosis not present

## 2016-08-30 DIAGNOSIS — R05 Cough: Secondary | ICD-10-CM | POA: Diagnosis not present

## 2016-08-30 LAB — POCT INFLUENZA A/B
Influenza A, POC: NEGATIVE
Influenza B, POC: NEGATIVE

## 2016-08-30 MED ORDER — AZITHROMYCIN 250 MG PO TABS: ORAL_TABLET | ORAL | 0 refills | Status: DC

## 2016-08-30 MED ORDER — OSELTAMIVIR PHOSPHATE 75 MG PO CAPS: 75.0000 mg | ORAL_CAPSULE | Freq: Two times a day (BID) | ORAL | 0 refills | Status: AC

## 2016-08-30 MED ORDER — PREDNISONE 10 MG PO TABS: ORAL_TABLET | ORAL | 0 refills | Status: DC

## 2016-08-30 NOTE — Progress Notes (Signed)
Subjective:    Patient ID: John Stone, male    DOB: 12-16-1950, 66 y.o.   MRN: [id number]  HPI  66 year old male who presents to the office today for flu like symptoms that started 24 hours ago. He reports generalized fatigue and body aches with fever. He was treated last week for a cough with tessalon pearls and Flonase. He reports that his symptoms have improved with that and he is not coughing as much.   He denies any sick contacts.   He has been using OTC cold medication which he reports helps with the symptoms.   Review of Systems  Constitutional: Positive for activity change, chills, fatigue and fever.  HENT: Negative.   Eyes: Negative.   Respiratory: Negative.   Cardiovascular: Negative.   Gastrointestinal: Negative.   Musculoskeletal: Positive for myalgias.  Skin: Negative.   Neurological: Negative.   All other systems reviewed and are negative.  Past Medical History:  Diagnosis Date  . Blood in stool   . BPH (benign prostatic hyperplasia)   . Chicken pox   . Hypertension     Social History   Social History  . Marital status: Married    Spouse name: N/A  . Number of children: N/A  . Years of education: N/A   Occupational History  . Not on file.   Social History Main Topics  . Smoking status: Never Smoker  . Smokeless tobacco: Never Used  . Alcohol use No  . Drug use: No  . Sexual activity: Not on file   Other Topics Concern  . Not on file   Social History Narrative   Married   8 kids    6 live locally.       Work in [employer]   He likes to stay active in [employer].     Past Surgical History:  Procedure Laterality Date  . PROSTATE SURGERY  2007    Family History  Problem Relation Age of Onset  . Hypertension Mother     parent  . Hypertension Maternal Grandfather     grandparent   . Colon cancer Neg Hx   . Colon polyps Neg Hx   . Esophageal cancer Neg Hx   . Rectal cancer Neg Hx   . Stomach cancer Neg Hx      No Known Allergies  Current Outpatient Prescriptions on File Prior to Visit  Medication Sig Dispense Refill  . aspirin 81 MG chewable tablet Chew 81 mg by mouth daily.    . benzonatate (TESSALON) 200 MG capsule Take 1 capsule (200 mg total) by mouth 3 (three) times daily as needed for cough. 20 capsule 0  . bisacodyl (DULCOLAX) 5 MG EC tablet Take 5 mg by mouth once. X 4 colon 9-1    . fluticasone (FLONASE) 50 MCG/ACT nasal spray Place 2 sprays into both nostrils daily. 16 g 6  . lisinopril (PRINIVIL,ZESTRIL) 20 MG tablet Take 1 tablet (20 mg total) by mouth daily. 30 tablet 1  . lisinopril-hydrochlorothiazide (PRINZIDE,ZESTORETIC) 20-25 MG tablet Take 1 tablet by mouth daily. 90 tablet 3  . polyethylene glycol powder (MIRALAX) powder Take 1 Container by mouth once. 238 grams for colon 9-1     No current facility-administered medications on file prior to visit.     BP (!) 152/90   Pulse 99   Temp (!) 102.6 F (39.2 C) (Oral)   Wt 189 lb (85.7 kg)   SpO2 97%   BMI 32.44 kg/m  Objective:   Physical Exam  Constitutional: He is oriented to person, place, and time. He appears well-developed and well-nourished. No distress.  HENT:  Head: Normocephalic and atraumatic.  Right Ear: External ear normal.  Left Ear: External ear normal.  Nose: Nose normal.  Mouth/Throat: Oropharynx is clear and moist. No oropharyngeal exudate.  Eyes: Conjunctivae and EOM are normal. Pupils are equal, round, and reactive to light. Right eye exhibits no discharge. Left eye exhibits no discharge. No scleral icterus.  Neck: Normal range of motion. Neck supple. No JVD present. No tracheal deviation present. No thyromegaly present.  Cardiovascular: Normal rate, regular rhythm, normal heart sounds and intact distal pulses.  Exam reveals no gallop and no friction rub.   No murmur heard. Pulmonary/Chest: Effort normal. No stridor. No respiratory distress. He has decreased breath sounds in the left lower  field. He has no wheezes. He has no rales. He exhibits no tenderness.  Abdominal: Soft. Bowel sounds are normal. He exhibits no distension and no mass. There is no tenderness. There is no rebound and no guarding.  Lymphadenopathy:    He has no cervical adenopathy.  Neurological: He is alert and oriented to person, place, and time.  Skin: Skin is warm and dry. No rash noted. He is not diaphoretic. No erythema. No pallor.  Psychiatric: He has a normal mood and affect. His behavior is normal. Judgment and thought content normal.  Nursing note and vitals reviewed.      Assessment & Plan:   1. Flu-like symptoms - DG Chest 2 View; Future- r/o pneumonia  - oseltamivir (TAMIFLU) 75 MG capsule; Take 1 capsule (75 mg total) by mouth 2 (two) times daily.  Dispense: 10 capsule; Refill: 0 - POC Influenza A/B- negative  - Follow up if no improvement in the next 2-3 days or go to the ER if symptoms worsen - Stay hydrated  - rest - Tylenol/motrin if no improvement   Dorothyann Peng, NP

## 2016-08-30 NOTE — Telephone Encounter (Signed)
Updated patient on his x-ray results which did not show pneumonia but showed possible bronchitis. Will treat with prednisone and a Z-Pak area and he does not need to get the Tamiflu filled as his symptoms are likely from the bronchitis.  Return precautions advised

## 2016-09-07 ENCOUNTER — Encounter: Payer: Self-pay | Admitting: Adult Health

## 2016-09-07 ENCOUNTER — Ambulatory Visit (INDEPENDENT_AMBULATORY_CARE_PROVIDER_SITE_OTHER): Payer: Medicare Other | Admitting: Adult Health

## 2016-09-07 VITALS — BP 144/80 | Temp 98.6°F | Ht 64.0 in | Wt 190.3 lb

## 2016-09-07 DIAGNOSIS — I10 Essential (primary) hypertension: Secondary | ICD-10-CM

## 2016-09-07 MED ORDER — AMLODIPINE BESYLATE 5 MG PO TABS: 5.0000 mg | ORAL_TABLET | Freq: Every day | ORAL | 3 refills | Status: DC

## 2016-09-07 NOTE — Progress Notes (Signed)
Subjective:    Patient ID: John Stone, male    DOB: May 28, 1951, 66 y.o.   MRN: [id number]  HPI  66 year old male who  has a past medical history of Blood in stool; BPH (benign prostatic hyperplasia); Chicken pox; and Hypertension. He presents to the office today for follow up regarding essential hypertension. During his last visit, 20 mg of lisinopril was added to his regimen.   He is checking his blood pressure "sometimes" and reports that his blood pressure readings at home have been in the 140's/80's  He denies any dizziness, lightheadedness, or headached.   BP Readings from Last 3 Encounters:  09/07/16 (!) 144/80  08/30/16 (!) 152/90  08/23/16 (!) 158/70    Review of Systems See HPI   Past Medical History:  Diagnosis Date  . Blood in stool   . BPH (benign prostatic hyperplasia)   . Chicken pox   . Hypertension     Social History   Social History  . Marital status: Married    Spouse name: N/A  . Number of children: N/A  . Years of education: N/A   Occupational History  . Not on file.   Social History Main Topics  . Smoking status: Never Smoker  . Smokeless tobacco: Never Used  . Alcohol use No  . Drug use: No  . Sexual activity: Not on file   Other Topics Concern  . Not on file   Social History Narrative   Married   8 kids    6 live locally.       Work in [employer]   He likes to stay active in [employer].     Past Surgical History:  Procedure Laterality Date  . PROSTATE SURGERY  2007    Family History  Problem Relation Age of Onset  . Hypertension Mother     parent  . Hypertension Maternal Grandfather     grandparent   . Colon cancer Neg Hx   . Colon polyps Neg Hx   . Esophageal cancer Neg Hx   . Rectal cancer Neg Hx   . Stomach cancer Neg Hx     No Known Allergies  Current Outpatient Prescriptions on File Prior to Visit  Medication Sig Dispense Refill  . aspirin 81 MG chewable tablet Chew 81 mg by mouth daily.     . bisacodyl (DULCOLAX) 5 MG EC tablet Take 5 mg by mouth once. X 4 colon 9-1    . fluticasone (FLONASE) 50 MCG/ACT nasal spray Place 2 sprays into both nostrils daily. 16 g 6  . lisinopril (PRINIVIL,ZESTRIL) 20 MG tablet Take 1 tablet (20 mg total) by mouth daily. 30 tablet 1  . lisinopril-hydrochlorothiazide (PRINZIDE,ZESTORETIC) 20-25 MG tablet Take 1 tablet by mouth daily. 90 tablet 3  . polyethylene glycol powder (MIRALAX) powder Take 1 Container by mouth once. 238 grams for colon 9-1    . predniSONE (DELTASONE) 10 MG tablet 40 mg x 3 days, 20 mg x 3 days, 10 mg x 3 days 21 tablet 0   No current facility-administered medications on file prior to visit.     BP (!) 144/80   Temp 98.6 F (37 C) (Oral)   Ht 5\' 4"  (1.626 m)   Wt 190 lb 4.8 oz (86.3 kg)   BMI 32.66 kg/m       Objective:   Physical Exam  Constitutional: He is oriented to person, place, and time. He appears well-developed and well-nourished. No distress.  Cardiovascular:  Normal rate, regular rhythm and intact distal pulses.  Exam reveals no gallop.   No murmur heard. Pulmonary/Chest: Effort normal and breath sounds normal. No respiratory distress. He has no rales. He exhibits no tenderness.  Musculoskeletal: Normal range of motion.  Neurological: He is alert and oriented to person, place, and time.  Skin: Skin is warm and dry. No rash noted. He is not diaphoretic. No erythema. No pallor.  Psychiatric: He has a normal mood and affect. His behavior is normal. Judgment and thought content normal.  Nursing note and vitals reviewed.     Assessment & Plan:  1. Essential hypertension - Will add Amlodipine  5 mg to daily regimen.  - amLODipine (NORVASC) 5 MG tablet; Take 1 tablet (5 mg total) by mouth daily.  Dispense: 30 tablet; Refill: 3 - I would like him to work on losing weight through a heart healthy diet  Dorothyann Peng, NP

## 2016-10-12 ENCOUNTER — Encounter: Payer: Self-pay | Admitting: Adult Health

## 2016-10-12 ENCOUNTER — Ambulatory Visit (INDEPENDENT_AMBULATORY_CARE_PROVIDER_SITE_OTHER): Payer: Medicare Other | Admitting: Adult Health

## 2016-10-12 VITALS — BP 128/86 | Temp 98.0°F | Ht 64.0 in | Wt 188.0 lb

## 2016-10-12 DIAGNOSIS — I10 Essential (primary) hypertension: Secondary | ICD-10-CM

## 2016-10-12 NOTE — Progress Notes (Signed)
Subjective:    Patient ID: John Stone, male    DOB: 11-Mar-1951, 66 y.o.   MRN: [id number]  HPI  66 year old male who  has a past medical history of Blood in stool; BPH (benign prostatic hyperplasia); Chicken pox; and Hypertension. He presents to the office today for one month follow up regarding hypertension. During the last visit his blood pressure was not controlled with Lisinopril 20 mg, and Zestoric 20-12.5, so 5 mg Amlodipine was added to his regimen.   Today in the office he reports that at home his blood pressures have been running in the 125- 130's.   He denies feeling lightheaded or dizzy.    Review of Systems  Constitutional: Negative.   Respiratory: Negative.   Cardiovascular: Negative.   Genitourinary: Negative.   Neurological: Negative.   All other systems reviewed and are negative.  Past Medical History:  Diagnosis Date  . Blood in stool   . BPH (benign prostatic hyperplasia)   . Chicken pox   . Hypertension     Social History   Social History  . Marital status: Married    Spouse name: N/A  . Number of children: N/A  . Years of education: N/A   Occupational History  . Not on file.   Social History Main Topics  . Smoking status: Never Smoker  . Smokeless tobacco: Never Used  . Alcohol use No  . Drug use: No  . Sexual activity: Not on file   Other Topics Concern  . Not on file   Social History Narrative   Married   8 kids    6 live locally.       Work in [employer]   He likes to stay active in [employer].     Past Surgical History:  Procedure Laterality Date  . PROSTATE SURGERY  2007    Family History  Problem Relation Age of Onset  . Hypertension Mother     parent  . Hypertension Maternal Grandfather     grandparent   . Colon cancer Neg Hx   . Colon polyps Neg Hx   . Esophageal cancer Neg Hx   . Rectal cancer Neg Hx   . Stomach cancer Neg Hx     No Known Allergies  Current Outpatient Prescriptions on File  Prior to Visit  Medication Sig Dispense Refill  . amLODipine (NORVASC) 5 MG tablet Take 1 tablet (5 mg total) by mouth daily. 30 tablet 3  . aspirin 81 MG chewable tablet Chew 81 mg by mouth daily.    . bisacodyl (DULCOLAX) 5 MG EC tablet Take 5 mg by mouth once. X 4 colon 9-1    . fluticasone (FLONASE) 50 MCG/ACT nasal spray Place 2 sprays into both nostrils daily. 16 g 6  . lisinopril (PRINIVIL,ZESTRIL) 20 MG tablet Take 1 tablet (20 mg total) by mouth daily. 30 tablet 1  . lisinopril-hydrochlorothiazide (PRINZIDE,ZESTORETIC) 20-25 MG tablet Take 1 tablet by mouth daily. 90 tablet 3  . polyethylene glycol powder (MIRALAX) powder Take 1 Container by mouth once. 238 grams for colon 9-1     No current facility-administered medications on file prior to visit.     BP 128/86   Temp 98 F (36.7 C) (Oral)   Ht 5\' 4"  (1.626 m)   Wt 188 lb (85.3 kg)   BMI 32.27 kg/m       Objective:   Physical Exam  Constitutional: He is oriented to person, place, and time. He  appears well-developed and well-nourished. No distress.  Eyes: Conjunctivae and EOM are normal. Pupils are equal, round, and reactive to light. Right eye exhibits no discharge. Left eye exhibits no discharge.  Neck: Normal range of motion. Neck supple.  Cardiovascular: Normal rate, regular rhythm and intact distal pulses.  Exam reveals no gallop and no friction rub.   No murmur heard. Pulmonary/Chest: Effort normal. No respiratory distress. He has no wheezes. He has no rales. He exhibits no tenderness.  Lymphadenopathy:    He has no cervical adenopathy.  Neurological: He is alert and oriented to person, place, and time.  Skin: Skin is warm and dry. No rash noted. He is not diaphoretic. No erythema. No pallor.  Psychiatric: He has a normal mood and affect. His behavior is normal. Judgment and thought content normal.  Nursing note and vitals reviewed.     Assessment & Plan:  1. Essential hypertension - At goal - Continue to work  on weight loss through diet and exercise - Follow up in August for CPE or sooner if needed  Dorothyann Peng, NP

## 2017-03-10 ENCOUNTER — Ambulatory Visit (INDEPENDENT_AMBULATORY_CARE_PROVIDER_SITE_OTHER): Payer: Medicare Other | Admitting: Family Medicine

## 2017-03-10 ENCOUNTER — Ambulatory Visit: Payer: Medicare Other | Admitting: Family Medicine

## 2017-03-10 ENCOUNTER — Encounter: Payer: Self-pay | Admitting: Family Medicine

## 2017-03-10 VITALS — BP 136/88 | HR 59 | Temp 98.2°F | Ht 64.0 in | Wt 188.3 lb

## 2017-03-10 DIAGNOSIS — L989 Disorder of the skin and subcutaneous tissue, unspecified: Secondary | ICD-10-CM | POA: Diagnosis not present

## 2017-03-10 DIAGNOSIS — L03811 Cellulitis of head [any part, except face]: Secondary | ICD-10-CM | POA: Diagnosis not present

## 2017-03-10 MED ORDER — DOXYCYCLINE HYCLATE 100 MG PO TABS: 100.0000 mg | ORAL_TABLET | Freq: Two times a day (BID) | ORAL | 0 refills | Status: DC

## 2017-03-10 MED ORDER — CEPHALEXIN 500 MG PO CAPS: 500.0000 mg | ORAL_CAPSULE | Freq: Two times a day (BID) | ORAL | 0 refills | Status: DC

## 2017-03-10 NOTE — Progress Notes (Signed)
HPI:  Acute visit for lump on scalp: -noticed this morning, mildly tender -denies: injuring, trauma, bite, fever, malaise, pruritis  ROS: See pertinent positives and negatives per HPI.  Past Medical History:  Diagnosis Date  . Blood in stool   . BPH (benign prostatic hyperplasia)   . Chicken pox   . Hypertension     Past Surgical History:  Procedure Laterality Date  . PROSTATE SURGERY  2007    Family History  Problem Relation Age of Onset  . Hypertension Mother        parent  . Hypertension Maternal Grandfather        grandparent   . Colon cancer Neg Hx   . Colon polyps Neg Hx   . Esophageal cancer Neg Hx   . Rectal cancer Neg Hx   . Stomach cancer Neg Hx     Social History   Social History  . Marital status: Married    Spouse name: N/A  . Number of children: N/A  . Years of education: N/A   Social History Main Topics  . Smoking status: Never Smoker  . Smokeless tobacco: Never Used  . Alcohol use No  . Drug use: No  . Sexual activity: Not Asked   Other Topics Concern  . None   Social History Narrative   Married   8 kids    6 live locally.       Work in [employer]   He likes to stay active in [employer].      Current Outpatient Prescriptions:  .  amLODipine (NORVASC) 5 MG tablet, Take 1 tablet (5 mg total) by mouth daily., Disp: 30 tablet, Rfl: 3 .  aspirin 81 MG chewable tablet, Chew 81 mg by mouth daily., Disp: , Rfl:  .  bisacodyl (DULCOLAX) 5 MG EC tablet, Take 5 mg by mouth once. X 4 colon 9-1, Disp: , Rfl:  .  fluticasone (FLONASE) 50 MCG/ACT nasal spray, Place 2 sprays into both nostrils daily., Disp: 16 g, Rfl: 6 .  lisinopril (PRINIVIL,ZESTRIL) 20 MG tablet, Take 1 tablet (20 mg total) by mouth daily., Disp: 30 tablet, Rfl: 1 .  lisinopril-hydrochlorothiazide (PRINZIDE,ZESTORETIC) 20-25 MG tablet, Take 1 tablet by mouth daily., Disp: 90 tablet, Rfl: 3 .  polyethylene glycol powder (MIRALAX) powder, Take 1 Container by mouth once.  238 grams for colon 9-1, Disp: , Rfl:  .  cephALEXin (KEFLEX) 500 MG capsule, Take 1 capsule (500 mg total) by mouth 2 (two) times daily., Disp: 10 capsule, Rfl: 0 .  doxycycline (VIBRA-TABS) 100 MG tablet, Take 1 tablet (100 mg total) by mouth 2 (two) times daily., Disp: 14 tablet, Rfl: 0  EXAM:  Vitals:   03/10/17 1034  BP: 136/88  Pulse: (!) 59  Temp: 98.2 F (36.8 C)    Body mass index is 32.32 kg/m.  GENERAL: vitals reviewed and listed above, alert, oriented, appears well hydrated and in no acute distress  HEENT: atraumatic, conjunttiva clear, no obvious abnormalities on inspection of external nose and ears  NECK: no obvious masses on inspection  SKIN: small 4-44mm in diameter superficial erosion of skin L occipital scalp region with area ~ 2 cm in diameter of surrounding edema and erythema of the skin=  MS: moves all extremities without noticeable abnormality  PSYCH: pleasant and cooperative, no obvious depression or anxiety  ASSESSMENT AND PLAN:  Discussed the following assessment and plan:  Skin lesion  Cellulitis of head except face  -appears to have small wound with  mild cellulitis of the scalp - unsure etiology of the wound -discussed options and he opted to try abx, wound care and close follow up -Patient advised to return or notify a doctor immediately if symptoms worsen or persist or new concerns arise. Discussed emergency options for over the weekend if worsening.  Patient Instructions  BEFORE YOU LEAVE: -follow up: next week   Start the antibiotics today and take as instructed  Keep area clean and dry and check several times per day  Compresses with warm wash cloth twice daily  Seek care promptly in the interim if worsening or new concerns.    Colin Benton R., DO

## 2017-03-10 NOTE — Patient Instructions (Addendum)
BEFORE YOU LEAVE: -follow up: next week   Start the antibiotics today and take as instructed  Keep area clean and dry and check several times per day  Compresses with warm wash cloth twice daily  Seek care promptly in the interim if worsening or new concerns.

## 2017-05-18 ENCOUNTER — Other Ambulatory Visit: Payer: Self-pay | Admitting: Adult Health

## 2017-05-18 DIAGNOSIS — I10 Essential (primary) hypertension: Secondary | ICD-10-CM

## 2017-05-18 DIAGNOSIS — E669 Obesity, unspecified: Secondary | ICD-10-CM

## 2017-05-19 NOTE — Telephone Encounter (Signed)
He is taking both doses of lisinopril   Ok to refill for 6 months

## 2017-05-19 NOTE — Telephone Encounter (Signed)
Sent to the pharmacy by e-scribe. 

## 2017-05-19 NOTE — Telephone Encounter (Signed)
Not sure which lisinopril the pt is taking.  Reviewed last two office notes but unclear

## 2017-09-28 ENCOUNTER — Encounter: Payer: Self-pay | Admitting: Adult Health

## 2017-09-28 ENCOUNTER — Ambulatory Visit (INDEPENDENT_AMBULATORY_CARE_PROVIDER_SITE_OTHER): Payer: Medicare Other | Admitting: Adult Health

## 2017-09-28 VITALS — BP 170/106 | Temp 98.4°F | Ht 64.0 in | Wt 186.0 lb

## 2017-09-28 DIAGNOSIS — Z23 Encounter for immunization: Secondary | ICD-10-CM | POA: Diagnosis not present

## 2017-09-28 DIAGNOSIS — I10 Essential (primary) hypertension: Secondary | ICD-10-CM | POA: Diagnosis not present

## 2017-09-28 MED ORDER — LISINOPRIL-HYDROCHLOROTHIAZIDE 20-25 MG PO TABS: 1.0000 | ORAL_TABLET | Freq: Every day | ORAL | 0 refills | Status: DC

## 2017-09-28 MED ORDER — LISINOPRIL 20 MG PO TABS: 20.0000 mg | ORAL_TABLET | Freq: Every day | ORAL | 0 refills | Status: DC

## 2017-09-28 MED ORDER — AMLODIPINE BESYLATE 5 MG PO TABS: 5.0000 mg | ORAL_TABLET | Freq: Every day | ORAL | 0 refills | Status: DC

## 2017-09-28 NOTE — Progress Notes (Signed)
Subjective:    Patient ID: John Stone, male    DOB: 08-19-1950, 67 y.o.   MRN: [id number]  HPI  67 year old male who  has a past medical history of Blood in stool, BPH (benign prostatic hyperplasia), Chicken pox, and Hypertension.  He presents to the office today for follow up regarding hypertension.  He has been out of his medications for "awhile". He denies any blurred vision but has headaches occasionally.   He has been well controlled in the past with his current regimen of Norvasc 5 mg, Lisinopril 20 mg, and Lisinopril HCTZ 20-25 mg   He would like to have his flu shot   Review of Systems Past Medical History:  Diagnosis Date  . Blood in stool   . BPH (benign prostatic hyperplasia)   . Chicken pox   . Hypertension     Social History   Socioeconomic History  . Marital status: Married    Spouse name: Not on file  . Number of children: Not on file  . Years of education: Not on file  . Highest education level: Not on file  Social Needs  . Financial resource strain: Not on file  . Food insecurity - worry: Not on file  . Food insecurity - inability: Not on file  . Transportation needs - medical: Not on file  . Transportation needs - non-medical: Not on file  Occupational History  . Not on file  Tobacco Use  . Smoking status: Never Smoker  . Smokeless tobacco: Never Used  Substance and Sexual Activity  . Alcohol use: No  . Drug use: No  . Sexual activity: Not on file  Other Topics Concern  . Not on file  Social History Narrative   Married   8 kids    6 live locally.       Work in [employer]   He likes to stay active in [employer].     Past Surgical History:  Procedure Laterality Date  . PROSTATE SURGERY  2007    Family History  Problem Relation Age of Onset  . Hypertension Mother        parent  . Hypertension Maternal Grandfather        grandparent   . Colon cancer Neg Hx   . Colon polyps Neg Hx   . Esophageal cancer Neg Hx   .  Rectal cancer Neg Hx   . Stomach cancer Neg Hx     No Known Allergies  Current Outpatient Medications on File Prior to Visit  Medication Sig Dispense Refill  . aspirin 81 MG chewable tablet Chew 81 mg by mouth daily.    . bisacodyl (DULCOLAX) 5 MG EC tablet Take 5 mg by mouth once. X 4 colon 9-1    . polyethylene glycol powder (MIRALAX) powder Take 1 Container by mouth once. 238 grams for colon 9-1    . amLODipine (NORVASC) 5 MG tablet Take 1 tablet (5 mg total) by mouth daily. (Patient not taking: Reported on 09/28/2017) 30 tablet 3  . lisinopril (PRINIVIL,ZESTRIL) 20 MG tablet Take 1 tablet (20 mg total) by mouth daily. (Patient not taking: Reported on 09/28/2017) 30 tablet 1  . lisinopril-hydrochlorothiazide (PRINZIDE,ZESTORETIC) 20-25 MG tablet TAKE 1 TABLET BY MOUTH DAILY (Patient not taking: Reported on 09/28/2017) 90 tablet 1   No current facility-administered medications on file prior to visit.     BP (!) 170/106 (BP Location: Left Arm)   Temp 98.4 F (36.9 C) (Oral)  Ht 5\' 4"  (1.626 m)   Wt 186 lb (84.4 kg)   BMI 31.93 kg/m       Objective:   Physical Exam  Constitutional: He is oriented to person, place, and time. He appears well-developed and well-nourished. No distress.  Cardiovascular: Normal rate, regular rhythm and intact distal pulses. Exam reveals no gallop and no friction rub.  No murmur heard. Pulmonary/Chest: Effort normal and breath sounds normal. No respiratory distress. He has no wheezes. He has no rales. He exhibits no tenderness.  Musculoskeletal: Normal range of motion. He exhibits no edema, tenderness or deformity.  Neurological: He is alert and oriented to person, place, and time.  Skin: Skin is warm and dry. No rash noted. He is not diaphoretic. No erythema. No pallor.  Psychiatric: He has a normal mood and affect. His behavior is normal. Judgment and thought content normal.  Nursing note and vitals reviewed.     Assessment & Plan:  1. Essential  hypertension - Follow for for CPE next week.  - amLODipine (NORVASC) 5 MG tablet; Take 1 tablet (5 mg total) by mouth daily.  Dispense: 30 tablet; Refill: 0 - lisinopril (PRINIVIL,ZESTRIL) 20 MG tablet; Take 1 tablet (20 mg total) by mouth daily.  Dispense: 30 tablet; Refill: 0 - lisinopril-hydrochlorothiazide (PRINZIDE,ZESTORETIC) 20-25 MG tablet; Take 1 tablet by mouth daily.  Dispense: 30 tablet; Refill: 0  2. Need for influenza vaccination  - Flu vaccine HIGH DOSE PF (Fluzone High dose)   Dorothyann Peng, NP

## 2017-09-28 NOTE — Patient Instructions (Signed)
I will send in your medications for thirty days.   Please follow up next week for your physical   Please let me know if you need anything

## 2017-10-05 ENCOUNTER — Encounter: Payer: Medicare Other | Admitting: Adult Health

## 2017-10-10 NOTE — Progress Notes (Signed)
Subjective:   John Stone is a 67 y.o. male who presents for an Initial Medicare Annual Wellness Visit.  Lives with wife Children 8  All are out of the home  Continues to run his [place] 10/12/16 - recommended weight loss and exercise   Diet Lipids 03/2016 chol/hdl ratio 4; chol 186; hdl 52; trig 66 A1c 5.8 Breakfast at times Varies day to day Eats vegetables and fruits  BMI 31.4   Exercise Swinging mop. Owns janitorial service    Health Maintenance Due  Topic Date Due  . Hepatitis C Screening  1951-05-07  . TETANUS/TDAP  03/07/1969   PSA 03/2016 Hepatitis C declines tdap declines  Colonoscopy; just rec'd the cologuard   Educated regarding shingrix  States he has cologuard at home   Cardiac Risk Factors include: advanced age (>90men, >75 women);dyslipidemia;hypertension;obesity (BMI >30kg/m2)    Objective:    Today's Vitals   10/11/17 1036  BP: 120/82  Weight: 182 lb (82.6 kg)  Height: 5' 4.25" (1.632 m)   Body mass index is 31 kg/m.  Advanced Directives 10/11/2017 06/27/2016 04/01/2016 06/29/2015  Does Patient Have a Medical Advance Directive? No No No No  Would patient like information on creating a medical advance directive? - No - patient declined information - No - patient declined information   Agreed to review hospital forms Advanced Directive; Reviewed advanced directive and agreed to receipt of information and discussion.  Focused face to face x  20 minutes discussing HCPOA and Living will and reviewed all the questions in the [place] forms. The patient voices understanding of HCPOA; LW reviewed and information provided on each question. Educated on how to revoke this HCPOA or LW at any time.   Also  discussed life prolonging measures (given a few examples) and where he could choose to initiate or not;  the ability to given the HCPOA power to change his living will or not if he cannot speak for  himself; as well as finalizing the will by 2 unrelated witnesses and notary.  Will call for questions and given information on [facility] pastoral department for further assistance.     Current Medications (verified) Outpatient Encounter Medications as of 10/11/2017  Medication Sig  . amLODipine (NORVASC) 5 MG tablet Take 1 tablet (5 mg total) by mouth daily.  Marland Kitchen aspirin 81 MG chewable tablet Chew 81 mg by mouth daily.  . bisacodyl (DULCOLAX) 5 MG EC tablet Take 5 mg by mouth once. X 4 colon 9-1  . lisinopril-hydrochlorothiazide (PRINZIDE,ZESTORETIC) 20-25 MG tablet Take 1 tablet by mouth daily.  . polyethylene glycol powder (MIRALAX) powder Take 1 Container by mouth once. 238 grams for colon 9-1  . [DISCONTINUED] amLODipine (NORVASC) 5 MG tablet Take 1 tablet (5 mg total) by mouth daily.  . [DISCONTINUED] lisinopril (PRINIVIL,ZESTRIL) 20 MG tablet Take 1 tablet (20 mg total) by mouth daily. (Patient not taking: Reported on 10/11/2017)  . [DISCONTINUED] lisinopril-hydrochlorothiazide (PRINZIDE,ZESTORETIC) 20-25 MG tablet Take 1 tablet by mouth daily.   No facility-administered encounter medications on file as of 10/11/2017.     Allergies (verified) Patient has no known allergies.   History: Past Medical History:  Diagnosis Date  . Blood in stool   . BPH (benign prostatic hyperplasia)   . Chicken pox   . Hypertension    Past Surgical History:  Procedure Laterality Date  . PROSTATE SURGERY  2007   Family History  Problem Relation Age of Onset  .  Hypertension Mother        parent  . Hypertension Maternal Grandfather        grandparent   . Colon cancer Neg Hx   . Colon polyps Neg Hx   . Esophageal cancer Neg Hx   . Rectal cancer Neg Hx   . Stomach cancer Neg Hx    Social History   Socioeconomic History  . Marital status: Married    Spouse name: Not on file  . Number of children: Not on file  . Years of education: Not on file  . Highest education level: Not on file    Social Needs  . Financial resource strain: Not on file  . Food insecurity - worry: Not on file  . Food insecurity - inability: Not on file  . Transportation needs - medical: Not on file  . Transportation needs - non-medical: Not on file  Occupational History  . Not on file  Tobacco Use  . Smoking status: Never Smoker  . Smokeless tobacco: Never Used  Substance and Sexual Activity  . Alcohol use: No  . Drug use: No  . Sexual activity: Not on file  Other Topics Concern  . Not on file  Social History Narrative   Married   8 kids    6 live locally.       Work in [employer]   He likes to stay active in [employer].    Tobacco Counseling Counseling given: Yes   Clinical Intake:    Activities of Daily Living In your present state of health, do you have any difficulty performing the following activities: 10/11/2017  Hearing? N  Vision? N  Difficulty concentrating or making decisions? N  Walking or climbing stairs? N  Dressing or bathing? N  Doing errands, shopping? N  Preparing Food and eating ? N  Using the Toilet? N  In the past six months, have you accidently leaked urine? N  Do you have problems with loss of bowel control? N  Managing your Medications? N  Managing your Finances? N  Housekeeping or managing your Housekeeping? N  Some recent data might be hidden     Immunizations and Health Maintenance Immunization History  Administered Date(s) Administered  . Influenza, High Dose Seasonal PF 09/28/2017   Health Maintenance Due  Topic Date Due  . Hepatitis C Screening  21-Nov-1950  . TETANUS/TDAP  03/07/1969    Patient Care Team: Dorothyann Peng, NP as PCP - General (Family Medicine)  Indicate any recent Medical Services you may have received from other than [facility] providers in the past year (date may be approximate).    Assessment:   This is a routine wellness examination for [place].  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 Patient Stated     Patient plans to keep working      Depression Screen PHQ 2/9 Scores 10/11/2017 10/11/2017 09/28/2017  PHQ - 2 Score 0 0 0  Fall Risk Fall Risk  10/11/2017 10/11/2017 09/28/2017  Falls in the past year? No No No      Cognitive Function: MMSE - Mini Mental State Exam 10/11/2017  Not completed: (No Data)   Ad8 score reviewed for issues:  Issues making decisions:  Less interest in hobbies / activities:  Repeats questions, stories (family complaining):  Trouble using ordinary gadgets (microwave, computer, phone):  Forgets the month or year:   Mismanaging finances:   Remembering appts:  Daily problems with thinking and/or memory: Ad8 score is=0          Screening Tests Health Maintenance  Topic Date Due  . Hepatitis C Screening  1951-06-01  . TETANUS/TDAP  03/07/1969  . PNA vac Low Risk Adult (1 of 2 - PCV13) 09/28/2018 (Originally 03/08/2015)  . COLONOSCOPY  10/11/2018 (Originally 03/07/2000)  . INFLUENZA VACCINE  Completed          Plan:      PCP Notes   Health Maintenance To have an eye exam in the near future Agreed to completed a HCPOA and LW Declined Hep c; tdap;  Has cologuard at home and plans to complete this   Educated on pre-diabetes for future reference  Encouraged 10 lb weight loss to reduce risk   Abnormal Screens  BMI as noted   Referrals  none  Patient concerns; None  Nurse Concerns; Educated on preventive measures   Next PCP  apt Seen today; labs drawn today    I have personally reviewed and noted the following in the patient's chart:   . Medical and social history . Use of alcohol, tobacco or illicit drugs  . Current medications and supplements . Functional ability and status . Nutritional status . Physical activity . Advanced directives . List of other physicians . Hospitalizations, surgeries, and ER visits in previous 12 months . Vitals . Screenings to include cognitive, depression, and falls . Referrals and appointments  In addition, I have reviewed and discussed with patient certain preventive protocols, quality metrics, and best practice recommendations. A written personalized care plan for preventive services as well as general preventive health recommendations were provided to patient.     Wynetta Fines, RN   10/11/2017

## 2017-10-11 ENCOUNTER — Ambulatory Visit (INDEPENDENT_AMBULATORY_CARE_PROVIDER_SITE_OTHER): Payer: Medicare Other | Admitting: Adult Health

## 2017-10-11 ENCOUNTER — Ambulatory Visit (INDEPENDENT_AMBULATORY_CARE_PROVIDER_SITE_OTHER): Payer: Medicare Other

## 2017-10-11 ENCOUNTER — Encounter: Payer: Self-pay | Admitting: Adult Health

## 2017-10-11 VITALS — BP 120/82 | Temp 98.1°F | Ht 64.25 in | Wt 182.0 lb

## 2017-10-11 VITALS — BP 120/82 | Ht 64.25 in | Wt 182.0 lb

## 2017-10-11 DIAGNOSIS — R972 Elevated prostate specific antigen [PSA]: Secondary | ICD-10-CM

## 2017-10-11 DIAGNOSIS — I10 Essential (primary) hypertension: Secondary | ICD-10-CM

## 2017-10-11 DIAGNOSIS — Z0000 Encounter for general adult medical examination without abnormal findings: Secondary | ICD-10-CM | POA: Diagnosis not present

## 2017-10-11 DIAGNOSIS — Z6831 Body mass index (BMI) 31.0-31.9, adult: Secondary | ICD-10-CM

## 2017-10-11 DIAGNOSIS — E668 Other obesity: Secondary | ICD-10-CM | POA: Diagnosis not present

## 2017-10-11 DIAGNOSIS — E782 Mixed hyperlipidemia: Secondary | ICD-10-CM | POA: Diagnosis not present

## 2017-10-11 LAB — HEPATIC FUNCTION PANEL
ALT: 16 U/L (ref 0–53)
AST: 14 U/L (ref 0–37)
Albumin: 4.2 g/dL (ref 3.5–5.2)
Alkaline Phosphatase: 56 U/L (ref 39–117)
BILIRUBIN DIRECT: 0.1 mg/dL (ref 0.0–0.3)
BILIRUBIN TOTAL: 0.6 mg/dL (ref 0.2–1.2)
Total Protein: 7.3 g/dL (ref 6.0–8.3)

## 2017-10-11 LAB — BASIC METABOLIC PANEL
BUN: 19 mg/dL (ref 6–23)
CHLORIDE: 100 meq/L (ref 96–112)
CO2: 33 meq/L — AB (ref 19–32)
CREATININE: 1.12 mg/dL (ref 0.40–1.50)
Calcium: 10 mg/dL (ref 8.4–10.5)
GFR: 83.95 mL/min (ref >60.00)
GLUCOSE: 88 mg/dL (ref 70–99)
Potassium: 4 mEq/L (ref 3.5–5.1)
Sodium: 138 mEq/L (ref 135–145)

## 2017-10-11 LAB — LIPID PANEL
CHOL/HDL RATIO: 4
CHOLESTEROL: 185 mg/dL (ref 0–200)
HDL: 46.8 mg/dL (ref >39.00)
LDL CALC: 129 mg/dL — AB (ref 0–99)
NonHDL: 138.37
TRIGLYCERIDES: 45 mg/dL (ref 0.0–149.0)
VLDL: 9 mg/dL (ref 0.0–40.0)

## 2017-10-11 LAB — CBC WITH DIFFERENTIAL/PLATELET
BASOS PCT: 0.5 % (ref 0.0–3.0)
Basophils Absolute: 0 10*3/uL (ref 0.0–0.1)
EOS ABS: 0.1 10*3/uL (ref 0.0–0.7)
Eosinophils Relative: 1 % (ref 0.0–5.0)
HCT: 47.4 % (ref 39.0–52.0)
Hemoglobin: 15.7 g/dL (ref 13.0–17.0)
LYMPHS ABS: 1.8 10*3/uL (ref 0.7–4.0)
Lymphocytes Relative: 27.5 % (ref 12.0–46.0)
MCHC: 33.2 g/dL (ref 30.0–36.0)
MCV: 84.7 fl (ref 78.0–100.0)
MONO ABS: 0.7 10*3/uL (ref 0.1–1.0)
Monocytes Relative: 9.8 % (ref 3.0–12.0)
NEUTROS ABS: 4.1 10*3/uL (ref 1.4–7.7)
Neutrophils Relative %: 61.2 % (ref 43.0–77.0)
PLATELETS: 199 10*3/uL (ref 150.0–400.0)
RBC: 5.6 Mil/uL (ref 4.22–5.81)
RDW: 14.2 % (ref 11.5–15.5)
WBC: 6.7 10*3/uL (ref 4.0–10.5)

## 2017-10-11 LAB — TSH: TSH: 0.79 u[IU]/mL (ref 0.35–4.50)

## 2017-10-11 LAB — PSA: PSA: 18.35 ng/mL — ABNORMAL HIGH (ref 0.10–4.00)

## 2017-10-11 MED ORDER — AMLODIPINE BESYLATE 5 MG PO TABS: 5.0000 mg | ORAL_TABLET | Freq: Every day | ORAL | 3 refills | Status: DC

## 2017-10-11 MED ORDER — LISINOPRIL-HYDROCHLOROTHIAZIDE 20-25 MG PO TABS: 1.0000 | ORAL_TABLET | Freq: Every day | ORAL | 3 refills | Status: DC

## 2017-10-11 NOTE — Progress Notes (Addendum)
Subjective:    Patient ID: John Stone, male    DOB: 1950-10-19, 67 y.o.   MRN: [id number]  HPI Patient presents for yearly preventative medicine examination. He had his annual influenza vaccine this year.  He does not want his pneumonia vaccine.  He declines colonoscopy but is going to do the Cologuard. He has made some slight changes to his diet.  He is avoiding sodium in the diet.  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  He has no other complaints/concerns at this time.   He is still working in [employer].  He is the owner of the business.  He has no plans of retiring in the near future.   All immunizations and health maintenance protocols were reviewed with the patient and needed orders were placed.   Appropriate screening laboratory values were ordered for the patient including screening of hyperlipidemia, renal function and hepatic function. If indicated by BPH, a PSA was ordered. He has a history of elevated PSA and prostate surgery in the past.    Medication reconciliation,  past medical history, social history, problem list and allergies were reviewed in detail with the patient.  He has not been taking his second dose of lisinopril as prescribed.  BP is well controlled today.  He has started taking his BP at home and reports that his heartrate is in the 50's and his SBP is in the 140's.  He is unable to recollect the DBP.  He does not need medication refills at this time.   Goals were established with regard to weight loss, exercise, and  diet in compliance with medications.  Continue to work on diet and exercise.  Any weight loss will aid in lowering his BP.   End of life planning was discussed. He does not have a living will or HCPOA. Have provided him with information.   Review of Systems  Constitutional: Negative for chills, fatigue and fever.  Respiratory: Negative for chest tightness, shortness of breath  and wheezing.   Cardiovascular: Negative for chest pain, palpitations and leg swelling.  Gastrointestinal: Negative for abdominal pain, blood in stool, constipation, diarrhea, nausea and vomiting.  Genitourinary: Negative for difficulty urinating and flank pain.  Musculoskeletal: Positive for arthralgias. Negative for joint swelling.  Neurological: Positive for headaches. Negative for dizziness and light-headedness.    Past Medical History:  Diagnosis Date  . Blood in stool   . BPH (benign prostatic hyperplasia)   . Chicken pox   . Hypertension     Social History   Socioeconomic History  . Marital status: Married    Spouse name: Not on file  . Number of children: Not on file  . Years of education: Not on file  . Highest education level: Not on file  Social Needs  . Financial resource strain: Not on file  . Food insecurity - worry: Not on file  . Food insecurity - inability: Not on file  . Transportation needs - medical: Not on file  . Transportation needs - non-medical: Not on file  Occupational History  . Not on file  Tobacco Use  . Smoking status: Never Smoker  . Smokeless tobacco: Never Used  Substance and Sexual Activity  . Alcohol use: No  . Drug use: No  . Sexual activity: Not on file  Other Topics Concern  . Not on file  Social History Narrative   Married   8 kids  6 live locally.       Work in [employer]   He likes to stay active in [employer].     Past Surgical History:  Procedure Laterality Date  . PROSTATE SURGERY  2007    Family History  Problem Relation Age of Onset  . Hypertension Mother        parent  . Hypertension Maternal Grandfather        grandparent   . Colon cancer Neg Hx   . Colon polyps Neg Hx   . Esophageal cancer Neg Hx   . Rectal cancer Neg Hx   . Stomach cancer Neg Hx     No Known Allergies  Current Outpatient Medications on File Prior to Visit  Medication Sig Dispense Refill  . aspirin 81 MG chewable  tablet Chew 81 mg by mouth daily.    Marland Kitchen lisinopril-hydrochlorothiazide (PRINZIDE,ZESTORETIC) 20-25 MG tablet Take 1 tablet by mouth daily. 30 tablet 0  . amLODipine (NORVASC) 5 MG tablet Take 1 tablet (5 mg total) by mouth daily. 30 tablet 0  . bisacodyl (DULCOLAX) 5 MG EC tablet Take 5 mg by mouth once. X 4 colon 9-1    . lisinopril (PRINIVIL,ZESTRIL) 20 MG tablet Take 1 tablet (20 mg total) by mouth daily. (Patient not taking: Reported on 10/11/2017) 30 tablet 0  . polyethylene glycol powder (MIRALAX) powder Take 1 Container by mouth once. 238 grams for colon 9-1     No current facility-administered medications on file prior to visit.     BP 120/82 (BP Location: Left Arm)   Temp 98.1 F (36.7 C) (Oral)   Ht 5' 4.25" (1.632 m)   Wt 182 lb (82.6 kg)   BMI 31.00 kg/m     Objective:   Physical Exam  Constitutional: He is oriented to person, place, and time. He appears well-developed and well-nourished. No distress.  HENT:  Head: Normocephalic.  Right Ear: External ear normal.  Left Ear: External ear normal.  Nose: Nose normal.  Mouth/Throat: Oropharynx is clear and moist. No oropharyngeal exudate.  Eyes: Conjunctivae and EOM are normal. Pupils are equal, round, and reactive to light. Right eye exhibits no discharge. Left eye exhibits no discharge.  Neck: Normal range of motion. Neck supple. No thyromegaly present.  Cardiovascular: Normal rate, regular rhythm and intact distal pulses. Exam reveals no gallop and no friction rub.  Murmur heard.  Systolic murmur is present with a grade of 1/6. RUSB  Pulmonary/Chest: Effort normal and breath sounds normal. No respiratory distress. He has no wheezes. He has no rales.  Abdominal: Soft. Bowel sounds are normal. He exhibits no distension. There is no hepatosplenomegaly. There is tenderness in the epigastric area. There is no rebound and no guarding.  Genitourinary:  Genitourinary Comments: Declined per patient  Musculoskeletal: Normal range  of motion.  Lymphadenopathy:    He has no cervical adenopathy.  Neurological: He is alert and oriented to person, place, and time. He has normal strength and normal reflexes. He exhibits normal muscle tone.  Upper and lower extremity strength 5/5  Skin: Skin is warm and dry. No rash noted. He is not diaphoretic. No erythema. No pallor.  Psychiatric: He has a normal mood and affect. His speech is normal and behavior is normal. Judgment and thought content normal.  Nursing note and vitals reviewed.     Assessment & Plan:  1. Essential hypertension Continue to work on diet and exercise.  Consider increase in medication in the future if blood pressure  is uncontrolled.  - amLODipine (NORVASC) 5 MG tablet; Take 1 tablet (5 mg total) by mouth daily.  Dispense: 90 tablet; Refill: 3 - lisinopril-hydrochlorothiazide (PRINZIDE,ZESTORETIC) 20-25 MG tablet; Take 1 tablet by mouth daily.  Dispense: 90 tablet; Refill: 3 - Basic metabolic panel - CBC with Differential/Platelet - Hepatic function panel - Lipid panel - TSH  2. Mixed hyperlipidemia Continue to modify diet and increase exercise.  - Basic metabolic panel - CBC with Differential/Platelet - Hepatic function panel - Lipid panel - TSH  3. Elevated PSA Will check labs and notify of results.  - PSA  4. Other obesity Continue to work on diet and exercise.  - Basic metabolic panel - CBC with Differential/Platelet - Hepatic function panel - Lipid panel - TSH  5. BMI 31.0-31.9,adult Continue to work on diet and exercise.  Will check labs and notify of results.  - Basic metabolic panel - CBC with Differential/Platelet - Hepatic function panel - Lipid panel - TSH  Tavyn Kurka C Zakeria Kulzer BSN RN NP student

## 2017-10-11 NOTE — Progress Notes (Signed)
I have reviewed and agree with this plan  

## 2017-10-11 NOTE — Patient Instructions (Addendum)
John Stone , Thank you for taking time to come for your Medicare Wellness Visit. I appreciate your ongoing commitment to your health goals. Please review the following plan we discussed and let me know if I can assist you in the future.   A Tetanus is recommended every 10 years. Medicare covers a tetanus if you have a cut or wound; otherwise, there may be a charge. If you had not had a tetanus with pertusses, known as the Tdap, you can take this anytime. ( whooping cough)   Shingrix is a vaccine for the prevention of Shingles in Adults 50 and older.  If you are on Medicare, you can request a prescription from your doctor to be filled at a pharmacy.  Please check with your benefits regarding applicable copays or out of pocket expenses.  The Shingrix is given in 2 vaccines approx 8 weeks apart. You must receive the 2nd dose prior to 6 months from receipt of the first.   Try to make an eye apt this year.     These are the goals we discussed: Goals    . Patient Stated     Patient plans to keep working       This is a list of the screening recommended for you and due dates:  Health Maintenance  Topic Date Due  .  Hepatitis C: One time screening is recommended by Center for Disease Control  (CDC) for  adults born from [birth year] through 1965.   11-29-1950  . Tetanus Vaccine  03/07/1969  . Pneumonia vaccines (1 of 2 - PCV13) 09/28/2018*  . Colon Cancer Screening  10/11/2018*  . Flu Shot  Completed  *Topic was postponed. The date shown is not the original due date.      Screening for Type 2 Diabetes A screening test for type 2 diabetes (type 2 diabetes mellitus) is a blood test to measure your blood sugar (glucose) level. This test is done to check for early signs of diabetes, before you develop symptoms. Type 2 diabetes is a long-term (chronic) disease that occurs when the pancreas does not make enough of a hormone called insulin. This results in high blood glucose levels, which can cause  many complications. You may be screened for type 2 diabetes as part of your regular health care, especially if you have a high risk for diabetes. Screening can help identify type 2 diabetes at its early stage (prediabetes). Identifying and treating prediabetes may delay or prevent development of type 2 diabetes. What are the risk factors for type 2 diabetes? The following factors may make you more likely to develop type 2 diabetes:  Having a parent or sibling (first-degree relative) who has diabetes.  Being overweight or obese.  Being of American-Indian, [place], Hispanic, Latino, Asian, or African-American descent.  Not getting enough exercise.  Being older than 45.  Having a history of diabetes during pregnancy (gestational diabetes).  Having low levels of good cholesterol (HDL-C) or high levels of blood fats (triglycerides).  Having high blood glucose in a previous blood test.  Having high blood pressure.  Having certain diseases or conditions, including: ? Acanthosis nigricans. This is a condition that causes dark skin on the neck, armpits, and groin. ? Polycystic ovary syndrome (PCOS). ? Heart disease.  Having delivered a baby who weighed more than 9 lb (4.1 kg).  Who should be screened for type 2 diabetes? Adults  Adults age 71 and older. These adults should be screened at least once every  three years.  Adults who are younger than 67, overweight, and have at least one other risk factor. These adults should be screened at least once every three years.  Adults who have normal blood glucose levels and two or more risk factors. These adults may be screened once every year (annually).  Women who have had gestational diabetes in the past. These women should be screened at least once every three years.  Pregnant women who have risk factors. These women should be screened at their first prenatal visit.  Pregnant women with no risk factors. These women should be  screened between weeks 24 and 28 of pregnancy. Children and adolescents  Children and adolescents should be screened for type 2 diabetes if they are overweight and have 2 of the following risk factors: ? A family history of type 2 diabetes. ? Being a member of a high risk race or ethnic group. ? Signs of insulin resistance or conditions associated with insulin resistance. ? A mother who had gestational diabetes while pregnant with him or her.  Screening should be done at least once every three years, starting at age 72. Your health care provider or your child's health care provider may recommend having a screening more or less often. What happens during screening? During screening, your health care provider may ask questions about:  Your health and your risk factors, including your activity level and any medical conditions that you have.  The health of your first-degree relatives.  Past pregnancies, if this applies.  Your health care provider will also do a physical exam, including a blood pressure measurement and blood tests. There are four blood tests that can be used to screen for type 2 diabetes. You may have one or more of the following:  A fasting plasma glucose test (FBG). You will not be allowed to eat for at least eight hours before a blood sample is taken.  A random blood glucose test. This test checks your blood glucose at any time of the day regardless of when you ate.  An oral glucose tolerance test (OGTT). This test measures your blood glucose at two times: ? After you have not eaten (have fasted) overnight. ? Two hours after you drink a glucose-containing beverage. A diagnosis can be made if the level is greater than 200 mg/dL.  An A1c test. This test provides information about blood glucose control over the previous three months.  What do the results mean? Your test results are a measurement of how much glucose is in your blood. Normal blood glucose levels mean that  you do not have diabetes or prediabetes. High blood glucose levels may mean that you have prediabetes or diabetes. Depending on the results, other tests may be needed to confirm the diagnosis. This information is not intended to replace advice given to you by your health care provider. Make sure you discuss any questions you have with your health care provider. Document Released: 05/28/2009 Document Revised: 01/07/2016 Document Reviewed: 05/29/2015 Elsevier Interactive Patient Education  2017 [place] Prevention in the Home Falls can cause injuries. They can happen to people of all ages. There are many things you can do to make your home safe and to help prevent falls. What can I do on the outside of my home?  Regularly fix the edges of walkways and driveways and fix any cracks.  Remove anything that might make you trip as you walk through a door, such as a raised step or threshold.  Trim any bushes or trees on the path to your home.  Use bright outdoor lighting.  Clear any walking paths of anything that might make someone trip, such as rocks or tools.  Regularly check to see if handrails are loose or broken. Make sure that both sides of any steps have handrails.  Any raised decks and porches should have guardrails on the edges.  Have any leaves, snow, or ice cleared regularly.  Use sand or salt on walking paths during winter.  Clean up any spills in your garage right away. This includes oil or grease spills. What can I do in the bathroom?  Use night lights.  Install grab bars by the toilet and in the tub and shower. Do not use towel bars as grab bars.  Use non-skid mats or decals in the tub or shower.  If you need to sit down in the shower, use a plastic, non-slip stool.  Keep the floor dry. Clean up any water that spills on the floor as soon as it happens.  Remove soap buildup in the tub or shower regularly.  Attach bath mats securely with double-sided  non-slip rug tape.  Do not have throw rugs and other things on the floor that can make you trip. What can I do in the bedroom?  Use night lights.  Make sure that you have a light by your bed that is easy to reach.  Do not use any sheets or blankets that are too big for your bed. They should not hang down onto the floor.  Have a firm chair that has side arms. You can use this for support while you get dressed.  Do not have throw rugs and other things on the floor that can make you trip. What can I do in the kitchen?  Clean up any spills right away.  Avoid walking on wet floors.  Keep items that you use a lot in easy-to-reach places.  If you need to reach something above you, use a strong step stool that has a grab bar.  Keep electrical cords out of the way.  Do not use floor polish or wax that makes floors slippery. If you must use wax, use non-skid floor wax.  Do not have throw rugs and other things on the floor that can make you trip. What can I do with my stairs?  Do not leave any items on the stairs.  Make sure that there are handrails on both sides of the stairs and use them. Fix handrails that are broken or loose. Make sure that handrails are as long as the stairways.  Check any carpeting to make sure that it is firmly attached to the stairs. Fix any carpet that is loose or worn.  Avoid having throw rugs at the top or bottom of the stairs. If you do have throw rugs, attach them to the floor with carpet tape.  Make sure that you have a light switch at the top of the stairs and the bottom of the stairs. If you do not have them, ask someone to add them for you. What else can I do to help prevent falls?  Wear shoes that: ? Do not have high heels. ? Have rubber bottoms. ? Are comfortable and fit you well. ? Are closed at the toe. Do not wear sandals.  If you use a stepladder: ? Make sure that it is fully opened. Do not climb a closed stepladder. ? Make sure that both  sides of the  stepladder are locked into place. ? Ask someone to hold it for you, if possible.  Clearly mark and make sure that you can see: ? Any grab bars or handrails. ? First and last steps. ? Where the edge of each step is.  Use tools that help you move around (mobility aids) if they are needed. These include: ? Canes. ? Walkers. ? Scooters. ? Crutches.  Turn on the lights when you go into a dark area. Replace any light bulbs as soon as they burn out.  Set up your furniture so you have a clear path. Avoid moving your furniture around.  If any of your floors are uneven, fix them.  If there are any pets around you, be aware of where they are.  Review your medicines with your doctor. Some medicines can make you feel dizzy. This can increase your chance of falling. Ask your doctor what other things that you can do to help prevent falls. This information is not intended to replace advice given to you by your health care provider. Make sure you discuss any questions you have with your health care provider. Document Released: 05/28/2009 Document Revised: 01/07/2016 Document Reviewed: 09/05/2014 Elsevier Interactive Patient Education  2018 [place] Maintenance, Male A healthy lifestyle and preventive care is important for your health and wellness. Ask your health care provider about what schedule of regular examinations is right for you. What should I know about weight and diet? Eat a Healthy Diet  Eat plenty of vegetables, fruits, whole grains, low-fat dairy products, and lean protein.  Do not eat a lot of foods high in solid fats, added sugars, or salt.  Maintain a Healthy Weight Regular exercise can help you achieve or maintain a healthy weight. You should:  Do at least 150 minutes of exercise each week. The exercise should increase your heart rate and make you sweat (moderate-intensity exercise).  Do strength-training exercises at least twice a week.  Watch  Your Levels of Cholesterol and Blood Lipids  Have your blood tested for lipids and cholesterol every 5 years starting at 68 years of age. If you are at high risk for heart disease, you should start having your blood tested when you are 69 years old. You may need to have your cholesterol levels checked more often if: ? Your lipid or cholesterol levels are high. ? You are older than 68 years of age. ? You are at high risk for heart disease.  What should I know about cancer screening? Many types of cancers can be detected early and may often be prevented. Lung Cancer  You should be screened every year for lung cancer if: ? You are a current smoker who has smoked for at least 30 years. ? You are a former smoker who has quit within the past 15 years.  Talk to your health care provider about your screening options, when you should start screening, and how often you should be screened.  Colorectal Cancer  Routine colorectal cancer screening usually begins at 68 years of age and should be repeated every 5-10 years until you are 68 years old. You may need to be screened more often if early forms of precancerous polyps or small growths are found. Your health care provider may recommend screening at an earlier age if you have risk factors for colon cancer.  Your health care provider may recommend using home test kits to check for hidden blood in the stool.  A small camera at the  end of a tube can be used to examine your colon (sigmoidoscopy or colonoscopy). This checks for the earliest forms of colorectal cancer.  Prostate and Testicular Cancer  Depending on your age and overall health, your health care provider may do certain tests to screen for prostate and testicular cancer.  Talk to your health care provider about any symptoms or concerns you have about testicular or prostate cancer.  Skin Cancer  Check your skin from head to toe regularly.  Tell your health care provider about any new  moles or changes in moles, especially if: ? There is a change in a mole's size, shape, or color. ? You have a mole that is larger than a pencil eraser.  Always use sunscreen. Apply sunscreen liberally and repeat throughout the day.  Protect yourself by wearing long sleeves, pants, a wide-brimmed hat, and sunglasses when outside.  What should I know about heart disease, diabetes, and high blood pressure?  If you are 34-37 years of age, have your blood pressure checked every 3-5 years. If you are 83 years of age or older, have your blood pressure checked every year. You should have your blood pressure measured twice-once when you are at a hospital or clinic, and once when you are not at a hospital or clinic. Record the average of the two measurements. To check your blood pressure when you are not at a hospital or clinic, you can use: ? An automated blood pressure machine at a pharmacy. ? A home blood pressure monitor.  Talk to your health care provider about your target blood pressure.  If you are between 65-53 years old, ask your health care provider if you should take aspirin to prevent heart disease.  Have regular diabetes screenings by checking your fasting blood sugar level. ? If you are at a normal weight and have a low risk for diabetes, have this test once every three years after the age of 59. ? If you are overweight and have a high risk for diabetes, consider being tested at a younger age or more often.  A one-time screening for abdominal aortic aneurysm (AAA) by ultrasound is recommended for men aged 28-75 years who are current or former smokers. What should I know about preventing infection? Hepatitis B If you have a higher risk for hepatitis B, you should be screened for this virus. Talk with your health care provider to find out if you are at risk for hepatitis B infection. Hepatitis C Blood testing is recommended for:  Everyone born from [birth year] through 1965.  Anyone with  known risk factors for hepatitis C.  Sexually Transmitted Diseases (STDs)  You should be screened each year for STDs including gonorrhea and chlamydia if: ? You are sexually active and are younger than 68 years of age. ? You are older than 68 years of age and your health care provider tells you that you are at risk for this type of infection. ? Your sexual activity has changed since you were last screened and you are at an increased risk for chlamydia or gonorrhea. Ask your health care provider if you are at risk.  Talk with your health care provider about whether you are at high risk of being infected with HIV. Your health care provider may recommend a prescription medicine to help prevent HIV infection.  What else can I do?  Schedule regular health, dental, and eye exams.  Stay current with your vaccines (immunizations).  Do not use any tobacco products, such as  cigarettes, chewing tobacco, and e-cigarettes. If you need help quitting, ask your health care provider.  Limit alcohol intake to no more than 2 drinks per day. One drink equals 12 ounces of beer, 5 ounces of wine, or 1 ounces of hard liquor.  Do not use street drugs.  Do not share needles.  Ask your health care provider for help if you need support or information about quitting drugs.  Tell your health care provider if you often feel depressed.  Tell your health care provider if you have ever been abused or do not feel safe at home. This information is not intended to replace advice given to you by your health care provider. Make sure you discuss any questions you have with your health care provider. Document Released: 01/28/2008 Document Revised: 03/30/2016 Document Reviewed: 05/05/2015 Elsevier Interactive Patient Education  [employer].

## 2017-10-11 NOTE — Progress Notes (Signed)
Subjective:    Patient ID: John Stone, male    DOB: 11-05-1950, 67 y.o.   MRN: [id number]  HPI  Patient presents for yearly preventative medicine examination. He is a pleasant 67 year old male who  has a past medical history of Blood in stool, BPH (benign prostatic hyperplasia), Chicken pox, and Hypertension.  He is currently taking Norvasc 5 mg and Prinzide 20-25 mg for hypertension control. When I saw him last week he had been out of his medications.   BP Readings from Last 3 Encounters:  10/11/17 120/82  09/28/17 (!) 170/106  03/10/17 136/88   He does have a history of prostate surgery - last PSA was 8. He has not been seen by Urology in multiple years.   All immunizations and health maintenance protocols were reviewed with the patient and needed orders were placed. He refuses his pneumonia vaccination.   Appropriate screening laboratory values were ordered for the patient including screening of hyperlipidemia, renal function and hepatic function. If indicated by BPH, a PSA was ordered.  Medication reconciliation,  past medical history, social history, problem list and allergies were reviewed in detail with the patient  Goals were established with regard to weight loss, exercise, and  diet in compliance with medications. He does not exercise outside of work,  but has been working on a low sodium diet.   End of life planning was discussed.  He is in need of a colonoscopy, he has never had one He has the colo guard box at his house. He does not do routine dental or vision care   He denies any acute complaints   Review of Systems  Constitutional: Negative.   HENT: Negative.   Eyes: Negative.   Respiratory: Negative.   Cardiovascular: Negative.   Gastrointestinal: Negative.   Endocrine: Negative.   Genitourinary: Negative.   Musculoskeletal: Negative.   Skin: Negative.   Neurological: Negative.   Hematological: Negative.   Psychiatric/Behavioral: Negative.      Past Medical History:  Diagnosis Date  . Blood in stool   . BPH (benign prostatic hyperplasia)   . Chicken pox   . Hypertension     Social History   Socioeconomic History  . Marital status: Married    Spouse name: Not on file  . Number of children: Not on file  . Years of education: Not on file  . Highest education level: Not on file  Social Needs  . Financial resource strain: Not on file  . Food insecurity - worry: Not on file  . Food insecurity - inability: Not on file  . Transportation needs - medical: Not on file  . Transportation needs - non-medical: Not on file  Occupational History  . Not on file  Tobacco Use  . Smoking status: Never Smoker  . Smokeless tobacco: Never Used  Substance and Sexual Activity  . Alcohol use: No  . Drug use: No  . Sexual activity: Not on file  Other Topics Concern  . Not on file  Social History Narrative   Married   8 kids    6 live locally.       Work in [employer]   He likes to stay active in [employer].     Past Surgical History:  Procedure Laterality Date  . PROSTATE SURGERY  2007    Family History  Problem Relation Age of Onset  . Hypertension Mother        parent  . Hypertension Maternal Grandfather  grandparent   . Colon cancer Neg Hx   . Colon polyps Neg Hx   . Esophageal cancer Neg Hx   . Rectal cancer Neg Hx   . Stomach cancer Neg Hx     No Known Allergies  Current Outpatient Medications on File Prior to Visit  Medication Sig Dispense Refill  . aspirin 81 MG chewable tablet Chew 81 mg by mouth daily.    Marland Kitchen lisinopril-hydrochlorothiazide (PRINZIDE,ZESTORETIC) 20-25 MG tablet Take 1 tablet by mouth daily. 30 tablet 0  . amLODipine (NORVASC) 5 MG tablet Take 1 tablet (5 mg total) by mouth daily. 30 tablet 0  . bisacodyl (DULCOLAX) 5 MG EC tablet Take 5 mg by mouth once. X 4 colon 9-1    . lisinopril (PRINIVIL,ZESTRIL) 20 MG tablet Take 1 tablet (20 mg total) by mouth daily. (Patient not  taking: Reported on 10/11/2017) 30 tablet 0  . polyethylene glycol powder (MIRALAX) powder Take 1 Container by mouth once. 238 grams for colon 9-1     No current facility-administered medications on file prior to visit.     BP 120/82 (BP Location: Left Arm)   Temp 98.1 F (36.7 C) (Oral)   Ht 5' 4.25" (1.632 m)   Wt 182 lb (82.6 kg)   BMI 31.00 kg/m       Objective:   Physical Exam  Constitutional: He is oriented to person, place, and time. He appears well-developed and well-nourished. No distress.  Obese   HENT:  Head: Normocephalic and atraumatic.  Right Ear: External ear normal.  Left Ear: External ear normal.  Nose: Nose normal.  Mouth/Throat: Oropharynx is clear and moist. No oropharyngeal exudate.  Eyes: Conjunctivae and EOM are normal. Pupils are equal, round, and reactive to light. Right eye exhibits no discharge. Left eye exhibits no discharge. No scleral icterus.  Neck: Normal range of motion. Neck supple. No JVD present. Carotid bruit is not present. No tracheal deviation present. No thyromegaly present.  Cardiovascular: Normal rate, regular rhythm and intact distal pulses. Exam reveals no gallop and no friction rub.  Murmur (possible slight murmur heard best at right sternal border ) heard. Pulmonary/Chest: Effort normal and breath sounds normal. No stridor. No respiratory distress. He has no wheezes. He has no rales. He exhibits no tenderness.  Abdominal: Soft. Bowel sounds are normal. He exhibits no distension and no mass. There is no tenderness. There is no rebound and no guarding.  Musculoskeletal: He exhibits no edema, tenderness or deformity.  Lymphadenopathy:    He has no cervical adenopathy.  Neurological: He is alert and oriented to person, place, and time. He has normal reflexes. He displays normal reflexes. No cranial nerve deficit. He exhibits normal muscle tone. Coordination normal.  Skin: Skin is warm and dry. No rash noted. He is not diaphoretic. No  erythema. No pallor.  Psychiatric: He has a normal mood and affect. His behavior is normal. Judgment and thought content normal.  Nursing note and vitals reviewed.     Assessment & Plan:  1. Essential hypertension - At goal. Will continue with current dosing  - Continue to work on diet and needs to start exercising  - amLODipine (NORVASC) 5 MG tablet; Take 1 tablet (5 mg total) by mouth daily.  Dispense: 90 tablet; Refill: 3 - lisinopril-hydrochlorothiazide (PRINZIDE,ZESTORETIC) 20-25 MG tablet; Take 1 tablet by mouth daily.  Dispense: 90 tablet; Refill: 3 - Basic metabolic panel - CBC with Differential/Platelet - Hepatic function panel - Lipid panel - TSH  2. Mixed  hyperlipidemia - Consider statin  - Basic metabolic panel - CBC with Differential/Platelet - Hepatic function panel - Lipid panel - TSH  3. Elevated PSA - PSA - Consider referral to urology  4. Other obesity  - Basic metabolic panel - CBC with Differential/Platelet - Hepatic function panel - Lipid panel - TSH  5. BMI 31.0-31.9,adult - Encouraged heart healthy diet and exercise outside of work  - Basic metabolic panel - CBC with Differential/Platelet - Hepatic function panel - Lipid panel - TSH  Dorothyann Peng, NP

## 2017-10-13 ENCOUNTER — Other Ambulatory Visit: Payer: Self-pay | Admitting: Family Medicine

## 2017-10-13 DIAGNOSIS — R972 Elevated prostate specific antigen [PSA]: Secondary | ICD-10-CM

## 2017-10-22 DIAGNOSIS — Z1211 Encounter for screening for malignant neoplasm of colon: Secondary | ICD-10-CM | POA: Diagnosis not present

## 2017-10-22 DIAGNOSIS — Z1212 Encounter for screening for malignant neoplasm of rectum: Secondary | ICD-10-CM | POA: Diagnosis not present

## 2017-11-06 ENCOUNTER — Other Ambulatory Visit: Payer: Self-pay | Admitting: Adult Health

## 2017-11-06 DIAGNOSIS — I10 Essential (primary) hypertension: Secondary | ICD-10-CM

## 2017-11-07 NOTE — Telephone Encounter (Signed)
FILLED ON 10/11/17 FOR 1 YEAR.  DENIED.  REFILL REQUEST IS TOO EARLY.

## 2017-11-21 DIAGNOSIS — N400 Enlarged prostate without lower urinary tract symptoms: Secondary | ICD-10-CM | POA: Diagnosis not present

## 2017-11-21 DIAGNOSIS — R972 Elevated prostate specific antigen [PSA]: Secondary | ICD-10-CM | POA: Diagnosis not present

## 2017-11-21 LAB — PSA: PSA: 18.4

## 2018-06-04 ENCOUNTER — Other Ambulatory Visit: Payer: Self-pay | Admitting: Adult Health

## 2018-06-04 DIAGNOSIS — I10 Essential (primary) hypertension: Secondary | ICD-10-CM

## 2018-06-04 DIAGNOSIS — E669 Obesity, unspecified: Secondary | ICD-10-CM

## 2018-06-05 ENCOUNTER — Other Ambulatory Visit: Payer: Self-pay | Admitting: Adult Health

## 2018-06-05 DIAGNOSIS — I10 Essential (primary) hypertension: Secondary | ICD-10-CM

## 2018-06-05 NOTE — Telephone Encounter (Signed)
Filled on 10/11/17 for 1 year.  Refill request is too early.

## 2018-06-05 NOTE — Telephone Encounter (Signed)
FILLED FOR 1 YEAR IN FEB 2019.  REFILL REQUEST IS EARLY.

## 2018-06-06 ENCOUNTER — Other Ambulatory Visit: Payer: Self-pay

## 2018-06-06 DIAGNOSIS — I10 Essential (primary) hypertension: Secondary | ICD-10-CM

## 2018-06-06 MED ORDER — LISINOPRIL-HYDROCHLOROTHIAZIDE 20-25 MG PO TABS: 1.0000 | ORAL_TABLET | Freq: Every day | ORAL | 3 refills | Status: DC

## 2018-06-06 NOTE — Progress Notes (Signed)
Per pharmacy they do not have any refills on file for Losartan-HCTZ. Pt has not been getting rx regularly and has only been picking up #30 every couple of months. Asked pharmacist to reiterate instructions to pt and to ask him to schedule OV for follow up.   [facility]. Thanks!

## 2018-06-12 ENCOUNTER — Encounter: Payer: Self-pay | Admitting: Adult Health

## 2018-06-12 ENCOUNTER — Ambulatory Visit (INDEPENDENT_AMBULATORY_CARE_PROVIDER_SITE_OTHER): Payer: Medicare Other | Admitting: Adult Health

## 2018-06-12 VITALS — BP 160/90 | Temp 98.2°F | Wt 191.0 lb

## 2018-06-12 DIAGNOSIS — I10 Essential (primary) hypertension: Secondary | ICD-10-CM

## 2018-06-12 DIAGNOSIS — Z23 Encounter for immunization: Secondary | ICD-10-CM | POA: Diagnosis not present

## 2018-06-12 MED ORDER — AMLODIPINE BESYLATE 5 MG PO TABS: 5.0000 mg | ORAL_TABLET | Freq: Every day | ORAL | 0 refills | Status: DC

## 2018-06-12 NOTE — Addendum Note (Signed)
Addended by: Miles Costain T on: 06/12/2018 09:38 AM   Modules accepted: Orders

## 2018-06-12 NOTE — Progress Notes (Signed)
Subjective:    Patient ID: John Stone, male    DOB: Jan 05, 1951, 67 y.o.   MRN: [id number]  HPI 67 year old male who  has a past medical history of Blood in stool, BPH (benign prostatic hyperplasia), Chicken pox, and Hypertension.  He presents to the office today for BP follow up. His pharmacy told him to follow up because he was not taking Norvasc. He denies any blurred vision or headaches.   He has been taking lisinopril/HCTZ   Review of Systems See HPI   Past Medical History:  Diagnosis Date  . Blood in stool   . BPH (benign prostatic hyperplasia)   . Chicken pox   . Hypertension     Social History   Socioeconomic History  . Marital status: Married    Spouse name: Not on file  . Number of children: Not on file  . Years of education: Not on file  . Highest education level: Not on file  Occupational History  . Not on file  Social Needs  . Financial resource strain: Not on file  . Food insecurity:    Worry: Not on file    Inability: Not on file  . Transportation needs:    Medical: Not on file    Non-medical: Not on file  Tobacco Use  . Smoking status: Never Smoker  . Smokeless tobacco: Never Used  Substance and Sexual Activity  . Alcohol use: No  . Drug use: No  . Sexual activity: Not on file  Lifestyle  . Physical activity:    Days per week: Not on file    Minutes per session: Not on file  . Stress: Not on file  Relationships  . Social connections:    Talks on phone: Not on file    Gets together: Not on file    Attends religious service: Not on file    Active member of club or organization: Not on file    Attends meetings of clubs or organizations: Not on file    Relationship status: Not on file  . Intimate partner violence:    Fear of current or ex partner: Not on file    Emotionally abused: Not on file    Physically abused: Not on file    Forced sexual activity: Not on file  Other Topics Concern  . Not on file  Social History Narrative     Married   8 kids    6 live locally.       Work in [employer]   He likes to stay active in [employer].     Past Surgical History:  Procedure Laterality Date  . PROSTATE SURGERY  2007    Family History  Problem Relation Age of Onset  . Hypertension Mother        parent  . Hypertension Maternal Grandfather        grandparent   . Colon cancer Neg Hx   . Colon polyps Neg Hx   . Esophageal cancer Neg Hx   . Rectal cancer Neg Hx   . Stomach cancer Neg Hx     No Known Allergies  Current Outpatient Medications on File Prior to Visit  Medication Sig Dispense Refill  . aspirin 81 MG chewable tablet Chew 81 mg by mouth daily.    . bisacodyl (DULCOLAX) 5 MG EC tablet Take 5 mg by mouth once. X 4 colon 9-1    . lisinopril-hydrochlorothiazide (PRINZIDE,ZESTORETIC) 20-25 MG tablet Take 1 tablet by mouth  daily. 90 tablet 3  . polyethylene glycol powder (MIRALAX) powder Take 1 Container by mouth once. 238 grams for colon 9-1    . amLODipine (NORVASC) 5 MG tablet Take 1 tablet (5 mg total) by mouth daily. (Patient not taking: Reported on 06/12/2018) 90 tablet 3   No current facility-administered medications on file prior to visit.     BP (!) 160/90   Temp 98.2 F (36.8 C) (Oral)   Wt 191 lb (86.6 kg)   BMI 32.53 kg/m       Objective:   Physical Exam  Constitutional: He is oriented to person, place, and time. He appears well-developed and well-nourished. No distress.  Cardiovascular: Normal rate, regular rhythm and normal heart sounds.  Pulmonary/Chest: Effort normal and breath sounds normal.  Neurological: He is alert and oriented to person, place, and time.  Skin: Skin is warm and dry. He is not diaphoretic.  Psychiatric: He has a normal mood and affect. His behavior is normal. Judgment and thought content normal.  Vitals reviewed.     Assessment & Plan:  1. Essential hypertension - BP is well controlled when he takes both medications.  - Educated on the  importance of taking his medications daily..  - Advised monitor at home  - Return precautions given  - amLODipine (NORVASC) 5 MG tablet; Take 1 tablet (5 mg total) by mouth daily.  Dispense: 90 tablet; Refill: 0   Dorothyann Peng, NP

## 2018-10-12 ENCOUNTER — Ambulatory Visit: Payer: Medicare Other

## 2018-10-15 NOTE — Progress Notes (Deleted)
Subjective:   John Stone is a 68 y.o. male who presents for Medicare Annual/Subsequent preventive examination.  Review of Systems:  No ROS.  Medicare Wellness Visit. Additional risk factors are reflected in the social history.    Sleep patterns: {SX; SLEEP PATTERNS:18802::"feels rested on waking","does not get up to void","gets up *** times nightly to void","sleeps *** hours nightly"}.    Home Safety/Smoke Alarms: Feels safe in home. Smoke alarms in place.  Living environment; residence and Firearm Safety: {Rehab home environment / accessibility:30080::"no firearms","firearms stored safely"}. Seat Belt Safety/Bike Helmet: Wears seat belt.   Male:   CCS-     PSA-  Lab Results  Component Value Date   PSA 18.35 (H) 10/11/2017   PSA 8.28 (H) 03/15/2016       Objective:    Vitals: There were no vitals taken for this visit.  There is no height or weight on file to calculate BMI.  Advanced Directives 10/11/2017 06/27/2016 04/01/2016 06/29/2015  Does Patient Have a Medical Advance Directive? No No No No  Would patient like information on creating a medical advance directive? - No - patient declined information - No - patient declined information    Tobacco Social History   Tobacco Use  Smoking Status Never Smoker  Smokeless Tobacco Never Used     Counseling given: Not Answered    Past Medical History:  Diagnosis Date  . Blood in stool   . BPH (benign prostatic hyperplasia)   . Chicken pox   . Hypertension    Past Surgical History:  Procedure Laterality Date  . PROSTATE SURGERY  2007   Family History  Problem Relation Age of Onset  . Hypertension Mother        parent  . Hypertension Maternal Grandfather        grandparent   . Colon cancer Neg Hx   . Colon polyps Neg Hx   . Esophageal cancer Neg Hx   . Rectal cancer Neg Hx   . Stomach cancer Neg Hx    Social History   Socioeconomic History  . Marital status: Married    Spouse name: Not on file   . Number of children: Not on file  . Years of education: Not on file  . Highest education level: Not on file  Occupational History  . Not on file  Social Needs  . Financial resource strain: Not on file  . Food insecurity:    Worry: Not on file    Inability: Not on file  . Transportation needs:    Medical: Not on file    Non-medical: Not on file  Tobacco Use  . Smoking status: Never Smoker  . Smokeless tobacco: Never Used  Substance and Sexual Activity  . Alcohol use: No  . Drug use: No  . Sexual activity: Not on file  Lifestyle  . Physical activity:    Days per week: Not on file    Minutes per session: Not on file  . Stress: Not on file  Relationships  . Social connections:    Talks on phone: Not on file    Gets together: Not on file    Attends religious service: Not on file    Active member of club or organization: Not on file    Attends meetings of clubs or organizations: Not on file    Relationship status: Not on file  Other Topics Concern  . Not on file  Social History Narrative   Married   8 kids  6 live locally.       Work in [employer]   He likes to stay active in [employer].     Outpatient Encounter Medications as of 10/16/2018  Medication Sig  . amLODipine (NORVASC) 5 MG tablet Take 1 tablet (5 mg total) by mouth daily.  Marland Kitchen aspirin 81 MG chewable tablet Chew 81 mg by mouth daily.  . bisacodyl (DULCOLAX) 5 MG EC tablet Take 5 mg by mouth once. X 4 colon 9-1  . lisinopril-hydrochlorothiazide (PRINZIDE,ZESTORETIC) 20-25 MG tablet Take 1 tablet by mouth daily.  . polyethylene glycol powder (MIRALAX) powder Take 1 Container by mouth once. 238 grams for colon 9-1   No facility-administered encounter medications on file as of 10/16/2018.     Activities of Daily Living No flowsheet data found.  Patient Care Team: Dorothyann Peng, NP as PCP - General (Family Medicine) Kathie Rhodes, MD as Consulting Physician (Urology)   Assessment:   This is a  routine wellness examination for Scott. Physical assessment deferred to PCP.   Exercise Activities and Dietary recommendations   Diet (meal preparation, eat out, water intake, caffeinated beverages, dairy products, fruits and vegetables): {Desc; diets:16563}   Goals    . Patient Stated     Patient plans to keep working       Fall Risk Fall Risk  10/11/2017 10/11/2017 09/28/2017  Falls in the past year? No No No    Depression Screen PHQ 2/9 Scores 10/11/2017 10/11/2017 09/28/2017  PHQ - 2 Score 0 0 0    Cognitive Function MMSE - Mini Mental State Exam 10/11/2017  Not completed: (No Data)        Immunization History  Administered Date(s) Administered  . Influenza, High Dose Seasonal PF 09/28/2017, 06/12/2018    Qualifies for Shingles Vaccine?   Screening Tests Health Maintenance  Topic Date Due  . Hepatitis C Screening  [birth year]50  . TETANUS/TDAP  03/07/1969  . COLONOSCOPY  03/07/2000  . PNA vac Low Risk Adult (1 of 2 - PCV13) 03/08/2015  . INFLUENZA VACCINE  Completed        Plan:     I have personally reviewed and noted the following in the patient's chart:   . Medical and social history . Use of alcohol, tobacco or illicit drugs  . Current medications and supplements . Functional ability and status . Nutritional status . Physical activity . Advanced directives . List of other physicians . Vitals . Screenings to include cognitive, depression, and falls . Referrals and appointments  In addition, I have reviewed and discussed with patient certain preventive protocols, quality metrics, and best practice recommendations. A written personalized care plan for preventive services as well as general preventive health recommendations were provided to patient.     Alphia Moh, RN  10/15/2018

## 2018-10-16 ENCOUNTER — Ambulatory Visit: Payer: Medicare Other

## 2018-10-16 ENCOUNTER — Telehealth: Payer: Self-pay

## 2018-10-16 ENCOUNTER — Ambulatory Visit (INDEPENDENT_AMBULATORY_CARE_PROVIDER_SITE_OTHER): Payer: Medicare Other | Admitting: Adult Health

## 2018-10-16 ENCOUNTER — Encounter: Payer: Self-pay | Admitting: Adult Health

## 2018-10-16 VITALS — BP 142/92 | Temp 98.6°F | Ht 63.5 in | Wt 185.0 lb

## 2018-10-16 DIAGNOSIS — E782 Mixed hyperlipidemia: Secondary | ICD-10-CM

## 2018-10-16 DIAGNOSIS — I10 Essential (primary) hypertension: Secondary | ICD-10-CM | POA: Diagnosis not present

## 2018-10-16 DIAGNOSIS — R972 Elevated prostate specific antigen [PSA]: Secondary | ICD-10-CM | POA: Diagnosis not present

## 2018-10-16 DIAGNOSIS — Z23 Encounter for immunization: Secondary | ICD-10-CM | POA: Diagnosis not present

## 2018-10-16 NOTE — Progress Notes (Signed)
Subjective:    Patient ID: John Stone, male    DOB: Nov 14, 1950, 68 y.o.   MRN: [id number]  HPI Patient presents for yearly preventative medicine examination. He is a pleasant 68 year old male who  has a past medical history of Blood in stool, BPH (benign prostatic hyperplasia), Chicken pox, and Hypertension.  Hypertension -Total of Synthroid 100 mcg.  Takes lisinopril hydrochlorothiazide 20-25 mg and Norvasc 5 mg daily  BP Readings from Last 3 Encounters:  10/16/18 (!) 142/92  06/12/18 (!) 160/90  10/11/17 120/82   Elevated PSA- He had a PSA in 2000 of 6.6 and underwent TRUS/BX by Dr. Gaynelle Arabian.  At this time his prostate measured 49 cc.  Most sent PSA was 18 on 10/11/2017.  He was referred back to urology and was seen in April 2019.  Apeers as a decided to continue observation with serial DRE and PSAs versus proceeding with further evaluation at that time with a biopsy. He has not been back to urology since April.   Hyperlipidemia - not currently prescribed statin.  Lab Results  Component Value Date   CHOL 185 10/11/2017   HDL 46.80 10/11/2017   LDLCALC 129 (H) 10/11/2017   TRIG 45.0 10/11/2017   CHOLHDL 4 10/11/2017    All immunizations and health maintenance protocols were reviewed with the patient and needed orders were placed. Needs prevnar.   Appropriate screening laboratory values were ordered for the patient including screening of hyperlipidemia, renal function and hepatic function. If indicated by BPH, a PSA was ordered.  Medication reconciliation,  past medical history, social history, problem list and allergies were reviewed in detail with the patient  Goals were established with regard to weight loss, exercise, and  diet in compliance with medications. He has cut back on portion size and is trying to exercise more often  Wt Readings from Last 3 Encounters:  10/16/18 185 lb (83.9 kg)  06/12/18 191 lb (86.6 kg)  10/11/17 182 lb (82.6 kg)   End of life  planning was discussed.  Refuses to have a colonoscopy done.  He returned cologuard but never heard what the results were ( we do not have these results)   Review of Systems  Constitutional: Negative.   HENT: Negative.   Eyes: Negative.   Respiratory: Negative.   Cardiovascular: Negative.   Gastrointestinal: Negative.   Endocrine: Negative.   Genitourinary: Negative.   Musculoskeletal: Negative.   Skin: Negative.   Allergic/Immunologic: Negative.   Neurological: Negative.   Hematological: Negative.   Psychiatric/Behavioral: Negative.   All other systems reviewed and are negative.  Past Medical History:  Diagnosis Date  . Blood in stool   . BPH (benign prostatic hyperplasia)   . Chicken pox   . Hypertension     Social History   Socioeconomic History  . Marital status: Married    Spouse name: Not on file  . Number of children: Not on file  . Years of education: Not on file  . Highest education level: Not on file  Occupational History  . Not on file  Social Needs  . Financial resource strain: Not on file  . Food insecurity:    Worry: Not on file    Inability: Not on file  . Transportation needs:    Medical: Not on file    Non-medical: Not on file  Tobacco Use  . Smoking status: Never Smoker  . Smokeless tobacco: Never Used  Substance and Sexual Activity  . Alcohol use: No  .  Drug use: No  . Sexual activity: Not on file  Lifestyle  . Physical activity:    Days per week: Not on file    Minutes per session: Not on file  . Stress: Not on file  Relationships  . Social connections:    Talks on phone: Not on file    Gets together: Not on file    Attends religious service: Not on file    Active member of club or organization: Not on file    Attends meetings of clubs or organizations: Not on file    Relationship status: Not on file  . Intimate partner violence:    Fear of current or ex partner: Not on file    Emotionally abused: Not on file    Physically  abused: Not on file    Forced sexual activity: Not on file  Other Topics Concern  . Not on file  Social History Narrative   Married   8 kids    6 live locally.       Work in [employer]   He likes to stay active in [employer].     Past Surgical History:  Procedure Laterality Date  . PROSTATE SURGERY  2007    Family History  Problem Relation Age of Onset  . Hypertension Mother        parent  . Hypertension Maternal Grandfather        grandparent   . Colon cancer Neg Hx   . Colon polyps Neg Hx   . Esophageal cancer Neg Hx   . Rectal cancer Neg Hx   . Stomach cancer Neg Hx     No Known Allergies  Current Outpatient Medications on File Prior to Visit  Medication Sig Dispense Refill  . amLODipine (NORVASC) 5 MG tablet Take 1 tablet (5 mg total) by mouth daily. 90 tablet 0  . lisinopril-hydrochlorothiazide (PRINZIDE,ZESTORETIC) 20-25 MG tablet Take 1 tablet by mouth daily. 90 tablet 3   No current facility-administered medications on file prior to visit.     BP (!) 142/92   Temp 98.6 F (37 C)   Ht 5' 3.5" (1.613 m)   Wt 185 lb (83.9 kg)   BMI 32.26 kg/m       Objective:   Physical Exam Vitals signs and nursing note reviewed.  Constitutional:      General: He is not in acute distress.    Appearance: Normal appearance. He is well-developed. He is obese. He is not ill-appearing, toxic-appearing or diaphoretic.  HENT:     Head: Normocephalic and atraumatic.     Right Ear: Tympanic membrane, ear canal and external ear normal. There is no impacted cerumen.     Left Ear: Tympanic membrane, ear canal and external ear normal. There is no impacted cerumen.     Nose: Nose normal. No congestion or rhinorrhea.     Mouth/Throat:     Mouth: Mucous membranes are dry.     Dentition: Abnormal dentition.     Pharynx: Oropharynx is clear. No oropharyngeal exudate or posterior oropharyngeal erythema.  Eyes:     General:        Right eye: No discharge.        Left  eye: No discharge.     Extraocular Movements: Extraocular movements intact.     Conjunctiva/sclera: Conjunctivae normal.     Pupils: Pupils are equal, round, and reactive to light.  Neck:     Musculoskeletal: Normal range of motion and neck supple.  Thyroid: No thyromegaly.     Trachea: No tracheal deviation.  Cardiovascular:     Rate and Rhythm: Normal rate and regular rhythm.     Pulses: Normal pulses.     Heart sounds: Normal heart sounds. No murmur. No friction rub. No gallop.   Pulmonary:     Effort: Pulmonary effort is normal. No respiratory distress.     Breath sounds: Normal breath sounds. No stridor. No wheezing, rhonchi or rales.  Chest:     Chest wall: No tenderness.  Abdominal:     General: Bowel sounds are normal. There is no distension.     Palpations: Abdomen is soft. There is no mass.     Tenderness: There is no abdominal tenderness. There is no right CVA tenderness, left CVA tenderness, guarding or rebound.     Hernia: No hernia is present.  Genitourinary:    Comments: Deferred  Musculoskeletal: Normal range of motion.        General: No swelling, tenderness, deformity or signs of injury.     Right lower leg: No edema.     Left lower leg: No edema.  Lymphadenopathy:     Cervical: No cervical adenopathy.  Skin:    General: Skin is warm and dry.     Capillary Refill: Capillary refill takes less than 2 seconds.     Coloration: Skin is not jaundiced or pale.     Findings: No bruising, erythema, lesion or rash.  Neurological:     General: No focal deficit present.     Mental Status: He is alert and oriented to person, place, and time. Mental status is at baseline.     Cranial Nerves: No cranial nerve deficit.     Sensory: No sensory deficit.     Motor: No weakness.     Coordination: Coordination normal.     Gait: Gait normal.     Deep Tendon Reflexes: Reflexes normal.  Psychiatric:        Mood and Affect: Mood normal.        Behavior: Behavior normal.          Thought Content: Thought content normal.        Judgment: Judgment normal.        Assessment & Plan:  1. Essential hypertension  - CBC with Differential/Platelet; Future - Comprehensive metabolic panel; Future - Lipid panel; Future - TSH; Future  2. Elevated PSA - Needs to follow up with urology  - PSA; Future  3. Mixed hyperlipidemia - Consider statin  - CBC with Differential/Platelet; Future - Comprehensive metabolic panel; Future - Lipid panel; Future - TSH; Future  4. Need for vaccination against Streptococcus pneumoniae  - Pneumococcal conjugate vaccine 13-valent  Dorothyann Peng, NP

## 2018-10-16 NOTE — Patient Instructions (Addendum)
It was great seeing you today   Please make an appointment for a lab visit to get your labs done   Continue to work on diet and exercise   Please follow up with urology

## 2018-10-16 NOTE — Telephone Encounter (Signed)
Author phoned pt. to offer to reschedule missed awv. No answer. Author left generic VM asking for return call if interested in rescheduling.

## 2018-10-29 ENCOUNTER — Other Ambulatory Visit: Payer: Self-pay | Admitting: Adult Health

## 2018-10-29 DIAGNOSIS — I10 Essential (primary) hypertension: Secondary | ICD-10-CM

## 2018-10-30 ENCOUNTER — Other Ambulatory Visit: Payer: Medicare Other

## 2018-10-30 NOTE — Telephone Encounter (Signed)
Sent to the pharmacy by e-scribe. 

## 2018-11-07 ENCOUNTER — Other Ambulatory Visit (INDEPENDENT_AMBULATORY_CARE_PROVIDER_SITE_OTHER): Payer: Medicare Other

## 2018-11-07 ENCOUNTER — Other Ambulatory Visit: Payer: Self-pay

## 2018-11-07 DIAGNOSIS — R972 Elevated prostate specific antigen [PSA]: Secondary | ICD-10-CM | POA: Diagnosis not present

## 2018-11-07 DIAGNOSIS — I10 Essential (primary) hypertension: Secondary | ICD-10-CM

## 2018-11-07 DIAGNOSIS — E782 Mixed hyperlipidemia: Secondary | ICD-10-CM

## 2018-11-07 LAB — COMPREHENSIVE METABOLIC PANEL
ALT: 22 U/L (ref 0–53)
AST: 18 U/L (ref 0–37)
Albumin: 4.4 g/dL (ref 3.5–5.2)
Alkaline Phosphatase: 47 U/L (ref 39–117)
BILIRUBIN TOTAL: 0.7 mg/dL (ref 0.2–1.2)
BUN: 23 mg/dL (ref 6–23)
CO2: 29 mEq/L (ref 19–32)
Calcium: 9.8 mg/dL (ref 8.4–10.5)
Chloride: 101 mEq/L (ref 96–112)
Creatinine, Ser: 1.03 mg/dL (ref 0.40–1.50)
GFR: 86.73 mL/min (ref >60.00)
Glucose, Bld: 82 mg/dL (ref 70–99)
Potassium: 3.9 mEq/L (ref 3.5–5.1)
Sodium: 139 mEq/L (ref 135–145)
TOTAL PROTEIN: 7.3 g/dL (ref 6.0–8.3)

## 2018-11-07 LAB — CBC WITH DIFFERENTIAL/PLATELET
Basophils Absolute: 0 10*3/uL (ref 0.0–0.1)
Basophils Relative: 0.4 % (ref 0.0–3.0)
Eosinophils Absolute: 0.1 10*3/uL (ref 0.0–0.7)
Eosinophils Relative: 2.1 % (ref 0.0–5.0)
HCT: 44.3 % (ref 39.0–52.0)
Hemoglobin: 14.8 g/dL (ref 13.0–17.0)
Lymphocytes Relative: 33.6 % (ref 12.0–46.0)
Lymphs Abs: 2 10*3/uL (ref 0.7–4.0)
MCHC: 33.4 g/dL (ref 30.0–36.0)
MCV: 84.8 fl (ref 78.0–100.0)
MONOS PCT: 10.3 % (ref 3.0–12.0)
Monocytes Absolute: 0.6 10*3/uL (ref 0.1–1.0)
Neutro Abs: 3.2 10*3/uL (ref 1.4–7.7)
Neutrophils Relative %: 53.6 % (ref 43.0–77.0)
Platelets: 207 10*3/uL (ref 150.0–400.0)
RBC: 5.22 Mil/uL (ref 4.22–5.81)
RDW: 14.6 % (ref 11.5–15.5)
WBC: 6 10*3/uL (ref 4.0–10.5)

## 2018-11-07 LAB — LIPID PANEL
Cholesterol: 203 mg/dL — ABNORMAL HIGH (ref 0–200)
HDL: 46.9 mg/dL (ref >39.00)
LDL Cholesterol: 143 mg/dL — ABNORMAL HIGH (ref 0–99)
NonHDL: 156.52
Total CHOL/HDL Ratio: 4
Triglycerides: 69 mg/dL (ref 0.0–149.0)
VLDL: 13.8 mg/dL (ref 0.0–40.0)

## 2018-11-07 LAB — TSH: TSH: 1.47 u[IU]/mL (ref 0.35–4.50)

## 2018-11-07 LAB — PSA: PSA: 30.78 ng/mL — ABNORMAL HIGH (ref 0.10–4.00)

## 2018-11-08 ENCOUNTER — Other Ambulatory Visit: Payer: Self-pay | Admitting: Adult Health

## 2018-11-08 MED ORDER — SIMVASTATIN 10 MG PO TABS: 10.0000 mg | ORAL_TABLET | Freq: Every day | ORAL | 3 refills | Status: DC

## 2019-05-07 ENCOUNTER — Ambulatory Visit (INDEPENDENT_AMBULATORY_CARE_PROVIDER_SITE_OTHER): Payer: Medicare Other | Admitting: Adult Health

## 2019-05-07 ENCOUNTER — Other Ambulatory Visit: Payer: Self-pay

## 2019-05-07 VITALS — BP 126/78 | HR 56 | Temp 97.7°F | Wt 190.8 lb

## 2019-05-07 DIAGNOSIS — I10 Essential (primary) hypertension: Secondary | ICD-10-CM

## 2019-05-07 DIAGNOSIS — Z23 Encounter for immunization: Secondary | ICD-10-CM | POA: Diagnosis not present

## 2019-05-07 DIAGNOSIS — E782 Mixed hyperlipidemia: Secondary | ICD-10-CM | POA: Diagnosis not present

## 2019-05-07 LAB — BASIC METABOLIC PANEL
BUN: 15 mg/dL (ref 6–23)
CO2: 30 mEq/L (ref 19–32)
Calcium: 10 mg/dL (ref 8.4–10.5)
Chloride: 100 mEq/L (ref 96–112)
Creatinine, Ser: 1.05 mg/dL (ref 0.40–1.50)
GFR: 84.7 mL/min (ref >60.00)
Glucose, Bld: 81 mg/dL (ref 70–99)
Potassium: 3.9 mEq/L (ref 3.5–5.1)
Sodium: 137 mEq/L (ref 135–145)

## 2019-05-07 LAB — LIPID PANEL
Cholesterol: 193 mg/dL (ref 0–200)
HDL: 44 mg/dL (ref >39.00)
LDL Cholesterol: 134 mg/dL — ABNORMAL HIGH (ref 0–99)
NonHDL: 148.91
Total CHOL/HDL Ratio: 4
Triglycerides: 74 mg/dL (ref 0.0–149.0)
VLDL: 14.8 mg/dL (ref 0.0–40.0)

## 2019-05-07 NOTE — Progress Notes (Signed)
Subjective:    Patient ID: Arsh Grauel, male    DOB: Jul 30, 1951, 68 y.o.   MRN: [id number]  HPI  68 year old male who  has a past medical history of Blood in stool, BPH (benign prostatic hyperplasia), Chicken pox, and Hypertension.  He presents to the office today for follow up regarding hypertension and hyperlipidemia. Marland Kitchen He is currently prescribed Norvasc 5 mg and  Lisinopril/HCTZ 20-25 mg.   During his CPE in March 2020 he was started on Simvastatin 10 mg for hyperlipidemia. He denies myalgias or fatigue since starting this medication  Review of Systems See HPI   Past Medical History:  Diagnosis Date  . Blood in stool   . BPH (benign prostatic hyperplasia)   . Chicken pox   . Hypertension     Social History   Socioeconomic History  . Marital status: Married    Spouse name: Not on file  . Number of children: Not on file  . Years of education: Not on file  . Highest education level: Not on file  Occupational History  . Not on file  Social Needs  . Financial resource strain: Not on file  . Food insecurity    Worry: Not on file    Inability: Not on file  . Transportation needs    Medical: Not on file    Non-medical: Not on file  Tobacco Use  . Smoking status: Never Smoker  . Smokeless tobacco: Never Used  Substance and Sexual Activity  . Alcohol use: No  . Drug use: No  . Sexual activity: Not on file  Lifestyle  . Physical activity    Days per week: Not on file    Minutes per session: Not on file  . Stress: Not on file  Relationships  . Social Herbalist on phone: Not on file    Gets together: Not on file    Attends religious service: Not on file    Active member of club or organization: Not on file    Attends meetings of clubs or organizations: Not on file    Relationship status: Not on file  . Intimate partner violence    Fear of current or ex partner: Not on file    Emotionally abused: Not on file    Physically abused: Not on file     Forced sexual activity: Not on file  Other Topics Concern  . Not on file  Social History Narrative   Married   8 kids    6 live locally.       Work in [employer]   He likes to stay active in [employer].     Past Surgical History:  Procedure Laterality Date  . PROSTATE SURGERY  2007    Family History  Problem Relation Age of Onset  . Hypertension Mother        parent  . Hypertension Maternal Grandfather        grandparent   . Colon cancer Neg Hx   . Colon polyps Neg Hx   . Esophageal cancer Neg Hx   . Rectal cancer Neg Hx   . Stomach cancer Neg Hx     No Known Allergies  Current Outpatient Medications on File Prior to Visit  Medication Sig Dispense Refill  . amLODipine (NORVASC) 5 MG tablet TAKE 1 TABLET(5 MG) BY MOUTH DAILY 90 tablet 3  . lisinopril-hydrochlorothiazide (PRINZIDE,ZESTORETIC) 20-25 MG tablet Take 1 tablet by mouth daily. 90 tablet 3  .  simvastatin (ZOCOR) 10 MG tablet Take 1 tablet (10 mg total) by mouth daily. 90 tablet 3   No current facility-administered medications on file prior to visit.     BP 126/78 (BP Location: Right Arm, Patient Position: Sitting, Cuff Size: Large)   Pulse (!) 56   Temp 97.7 F (36.5 C) (Temporal)   Wt 190 lb 12.8 oz (86.5 kg)   SpO2 97%   BMI 33.27 kg/m       Objective:   Physical Exam Vitals signs and nursing note reviewed.  Constitutional:      Appearance: Normal appearance.  Cardiovascular:     Rate and Rhythm: Normal rate and regular rhythm.     Pulses: Normal pulses.     Heart sounds: Normal heart sounds.  Pulmonary:     Effort: Pulmonary effort is normal.     Breath sounds: Normal breath sounds.  Neurological:     General: No focal deficit present.     Mental Status: He is alert and oriented to person, place, and time.  Psychiatric:        Mood and Affect: Mood normal.        Behavior: Behavior normal.        Thought Content: Thought content normal.        Judgment: Judgment normal.         Assessment & Plan:  1. Essential hypertension - BP well controlled. No change in medications  - Basic Metabolic Panel  2. Mixed hyperlipidemia - Consider increasing statin - Lipid panel  3. Need for influenza vaccination  - Flu Vaccine QUAD High Dose(Fluad)   Dorothyann Peng, NP

## 2019-05-07 NOTE — Patient Instructions (Addendum)
It was great seeing you today  We will follow up with you regarding your labs   Please follow up after March 25th for your physical exam

## 2019-07-19 ENCOUNTER — Other Ambulatory Visit: Payer: Self-pay | Admitting: Adult Health

## 2019-07-19 DIAGNOSIS — I10 Essential (primary) hypertension: Secondary | ICD-10-CM

## 2019-07-19 NOTE — Telephone Encounter (Signed)
Sent to the pharmacy by e-scribe for 6 months.  Pt has upcoming cpx on 11/12/19

## 2019-10-24 ENCOUNTER — Ambulatory Visit: Payer: Medicare Other | Attending: Internal Medicine

## 2019-10-24 DIAGNOSIS — Z23 Encounter for immunization: Secondary | ICD-10-CM

## 2019-10-24 NOTE — Progress Notes (Signed)
   Covid-19 Vaccination Clinic  Name:  John Stone    MRN: [id number] DOB: [birth year]  10/24/2019  [name] was observed post Covid-19 immunization for 15 minutes without incident. He was provided with Vaccine Information Sheet and instruction to access the V-Safe system.   [name] was instructed to call 911 with any severe reactions post vaccine: Marland Kitchen Difficulty breathing  . Swelling of face and throat  . A fast heartbeat  . A bad rash all over body  . Dizziness and weakness   Immunizations Administered    Name Date Dose VIS Date Route   Pfizer COVID-19 Vaccine 10/24/2019  8:42 AM 0.3 mL 07/26/2019 Intramuscular   Manufacturer: [place]   Lot: [id number]   [place]: [id number]

## 2019-11-12 ENCOUNTER — Other Ambulatory Visit: Payer: Self-pay

## 2019-11-12 ENCOUNTER — Ambulatory Visit (INDEPENDENT_AMBULATORY_CARE_PROVIDER_SITE_OTHER): Payer: Medicare Other

## 2019-11-12 ENCOUNTER — Ambulatory Visit (INDEPENDENT_AMBULATORY_CARE_PROVIDER_SITE_OTHER): Payer: Medicare Other | Admitting: Adult Health

## 2019-11-12 ENCOUNTER — Encounter: Payer: Self-pay | Admitting: Adult Health

## 2019-11-12 VITALS — BP 130/88 | Temp 96.3°F | Ht 64.0 in | Wt 193.0 lb

## 2019-11-12 DIAGNOSIS — M25551 Pain in right hip: Secondary | ICD-10-CM

## 2019-11-12 DIAGNOSIS — E782 Mixed hyperlipidemia: Secondary | ICD-10-CM | POA: Diagnosis not present

## 2019-11-12 DIAGNOSIS — I10 Essential (primary) hypertension: Secondary | ICD-10-CM | POA: Diagnosis not present

## 2019-11-12 DIAGNOSIS — R972 Elevated prostate specific antigen [PSA]: Secondary | ICD-10-CM

## 2019-11-12 DIAGNOSIS — G8929 Other chronic pain: Secondary | ICD-10-CM

## 2019-11-12 DIAGNOSIS — M1611 Unilateral primary osteoarthritis, right hip: Secondary | ICD-10-CM | POA: Diagnosis not present

## 2019-11-12 LAB — COMPREHENSIVE METABOLIC PANEL
ALT: 16 U/L (ref 0–53)
AST: 16 U/L (ref 0–37)
Albumin: 4.3 g/dL (ref 3.5–5.2)
Alkaline Phosphatase: 53 U/L (ref 39–117)
BUN: 19 mg/dL (ref 6–23)
CO2: 31 mEq/L (ref 19–32)
Calcium: 9.7 mg/dL (ref 8.4–10.5)
Chloride: 99 mEq/L (ref 96–112)
Creatinine, Ser: 1.03 mg/dL (ref 0.40–1.50)
GFR: 86.47 mL/min (ref >60.00)
Glucose, Bld: 77 mg/dL (ref 70–99)
Potassium: 4 mEq/L (ref 3.5–5.1)
Sodium: 137 mEq/L (ref 135–145)
Total Bilirubin: 0.7 mg/dL (ref 0.2–1.2)
Total Protein: 7.3 g/dL (ref 6.0–8.3)

## 2019-11-12 LAB — CBC WITH DIFFERENTIAL/PLATELET
Basophils Absolute: 0.1 10*3/uL (ref 0.0–0.1)
Basophils Relative: 0.7 % (ref 0.0–3.0)
Eosinophils Absolute: 0.1 10*3/uL (ref 0.0–0.7)
Eosinophils Relative: 1.6 % (ref 0.0–5.0)
HCT: 43.8 % (ref 39.0–52.0)
Hemoglobin: 14.6 g/dL (ref 13.0–17.0)
Lymphocytes Relative: 28.2 % (ref 12.0–46.0)
Lymphs Abs: 2 10*3/uL (ref 0.7–4.0)
MCHC: 33.3 g/dL (ref 30.0–36.0)
MCV: 85.4 fl (ref 78.0–100.0)
Monocytes Absolute: 0.8 10*3/uL (ref 0.1–1.0)
Monocytes Relative: 11.9 % (ref 3.0–12.0)
Neutro Abs: 4 10*3/uL (ref 1.4–7.7)
Neutrophils Relative %: 57.6 % (ref 43.0–77.0)
Platelets: 218 10*3/uL (ref 150.0–400.0)
RBC: 5.13 Mil/uL (ref 4.22–5.81)
RDW: 14 % (ref 11.5–15.5)
WBC: 7 10*3/uL (ref 4.0–10.5)

## 2019-11-12 LAB — LIPID PANEL
Cholesterol: 181 mg/dL (ref 0–200)
HDL: 39.8 mg/dL (ref >39.00)
LDL Cholesterol: 128 mg/dL — ABNORMAL HIGH (ref 0–99)
NonHDL: 141.47
Total CHOL/HDL Ratio: 5
Triglycerides: 69 mg/dL (ref 0.0–149.0)
VLDL: 13.8 mg/dL (ref 0.0–40.0)

## 2019-11-12 LAB — PSA: PSA: 53.75 ng/mL — ABNORMAL HIGH (ref 0.10–4.00)

## 2019-11-12 LAB — TSH: TSH: 0.96 u[IU]/mL (ref 0.35–4.50)

## 2019-11-12 MED ORDER — SIMVASTATIN 10 MG PO TABS: 10.0000 mg | ORAL_TABLET | Freq: Every day | ORAL | 3 refills | Status: DC

## 2019-11-12 NOTE — Patient Instructions (Signed)
It was great seeing you today   We will follow up with you regarding your blood work and xray   Please work on weight loss through diet and exercise  I will see you back in one year or sooner if needed

## 2019-11-12 NOTE — Progress Notes (Signed)
Subjective:    Patient ID: John Stone, male    DOB: 1951-06-09, 69 y.o.   MRN: [id number]  HPI Patient presents for yearly follow up examination. He is a pleasant 69 year old male who  has a past medical history of Blood in stool, BPH (benign prostatic hyperplasia), Chicken pox, and Hypertension.   He continues to work in [employer] services   Hypertension -he takes lisinopril/hydrochlorothiazide 20-25 mg and Norvasc 5 mg daily.  He denies dizziness, lightheadedness, chest pain, shortness of breath BP Readings from Last 3 Encounters:  11/12/19 130/88  05/07/19 126/78  10/16/18 (!) 142/92   Elevated PSA-he had an PSA in 2000 of 6.6 underwent biopsy by Dr. Gaynelle Arabian.  At this time his prostate measured 49 cc.  Then in February 2019 his PSA was 18 and he was referred back to urology and was seen in April 2019.  At this time it was decided to continue observation with serial DRE's and PSAs first proceeding with further evaluation at that time with a biopsy. He has not been seen by Urology since.   Hyperlipidemia - Currently prescribed Zocor 10 mg daily. He reports that he stopped this medication about 6 months ago, he denies side effects, states " I just stopped taking it, I don't have an excuse"  Lab Results  Component Value Date   CHOL 193 05/07/2019   HDL 44.00 05/07/2019   LDLCALC 134 (H) 05/07/2019   TRIG 74.0 05/07/2019   CHOLHDL 4 05/07/2019   Chronic right hip pain - has been present for many years. Reports that the pain " comes and goes" and is worse with walking, and bending and twisting. He was laying floor at home over the weekend and was stiff afterwards and had a hard time getting off the floor. He has not been taking anything OTC   All immunizations and health maintenance protocols were reviewed with the patient and needed orders were placed. He is getting his second covid vaccination in one week. Will hold pneumonia vaccination at this time.   Appropriate  screening laboratory values were ordered for the patient including screening of hyperlipidemia, renal function and hepatic function. If indicated by BPH, a PSA was ordered.  Medication reconciliation,  past medical history, social history, problem list and allergies were reviewed in detail with the patient  Goals were established with regard to weight loss, exercise, and  diet in compliance with medications  Wt Readings from Last 3 Encounters:  11/12/19 193 lb (87.5 kg)  05/07/19 190 lb 12.8 oz (86.5 kg)  10/16/18 185 lb (83.9 kg)    Review of Systems  Constitutional: Negative.   HENT: Negative.   Eyes: Negative.   Respiratory: Negative.   Cardiovascular: Negative.   Gastrointestinal: Negative.   Endocrine: Negative.   Genitourinary: Negative.   Musculoskeletal: Positive for arthralgias (hands and right hip).  Skin: Negative.   Allergic/Immunologic: Negative.   Neurological: Negative.   Hematological: Negative.   Psychiatric/Behavioral: Negative.   All other systems reviewed and are negative.  Past Medical History:  Diagnosis Date  . Blood in stool   . BPH (benign prostatic hyperplasia)   . Chicken pox   . Hypertension     Social History   Socioeconomic History  . Marital status: Married    Spouse name: Not on file  . Number of children: Not on file  . Years of education: Not on file  . Highest education level: Not on file  Occupational History  . Not  on file  Tobacco Use  . Smoking status: Never Smoker  . Smokeless tobacco: Never Used  Substance and Sexual Activity  . Alcohol use: No  . Drug use: No  . Sexual activity: Not on file  Other Topics Concern  . Not on file  Social History Narrative   Married   8 kids    6 live locally.       Work in [employer]   He likes to stay active in [employer].    Social Determinants of Health   Financial Resource Strain:   . Difficulty of Paying Living Expenses:   Food Insecurity:   . Worried About  Charity fundraiser in the Last Year:   . Arboriculturist in the Last Year:   Transportation Needs:   . Film/video editor (Medical):   Marland Kitchen Lack of Transportation (Non-Medical):   Physical Activity:   . Days of Exercise per Week:   . Minutes of Exercise per Session:   Stress:   . Feeling of Stress :   Social Connections:   . Frequency of Communication with Friends and Family:   . Frequency of Social Gatherings with Friends and Family:   . Attends Religious Services:   . Active Member of Clubs or Organizations:   . Attends Archivist Meetings:   Marland Kitchen Marital Status:   Intimate Partner Violence:   . Fear of Current or Ex-Partner:   . Emotionally Abused:   Marland Kitchen Physically Abused:   . Sexually Abused:     Past Surgical History:  Procedure Laterality Date  . PROSTATE SURGERY  2007    Family History  Problem Relation Age of Onset  . Hypertension Mother        parent  . Hypertension Maternal Grandfather        grandparent   . Colon cancer Neg Hx   . Colon polyps Neg Hx   . Esophageal cancer Neg Hx   . Rectal cancer Neg Hx   . Stomach cancer Neg Hx     No Known Allergies  Current Outpatient Medications on File Prior to Visit  Medication Sig Dispense Refill  . amLODipine (NORVASC) 5 MG tablet TAKE 1 TABLET(5 MG) BY MOUTH DAILY 90 tablet 3  . lisinopril-hydrochlorothiazide (ZESTORETIC) 20-25 MG tablet TAKE 1 TABLET BY MOUTH DAILY 90 tablet 1  . simvastatin (ZOCOR) 10 MG tablet Take 1 tablet (10 mg total) by mouth daily. (Patient not taking: Reported on 11/12/2019) 90 tablet 3   No current facility-administered medications on file prior to visit.    BP 130/88   Temp (!) 96.3 F (35.7 C) (Temporal)   Ht 5\' 4"  (1.626 m)   Wt 193 lb (87.5 kg)   BMI 33.13 kg/m       Objective:   Physical Exam Vitals and nursing note reviewed.  Constitutional:      General: He is not in acute distress.    Appearance: Normal appearance. He is well-developed and normal weight.    HENT:     Head: Normocephalic and atraumatic.     Right Ear: Tympanic membrane, ear canal and external ear normal. There is no impacted cerumen.     Left Ear: Tympanic membrane, ear canal and external ear normal. There is no impacted cerumen.     Nose: Nose normal. No congestion or rhinorrhea.     Mouth/Throat:     Mouth: Mucous membranes are moist.     Pharynx: Oropharynx is clear. No  oropharyngeal exudate or posterior oropharyngeal erythema.  Eyes:     General:        Right eye: No discharge.        Left eye: No discharge.     Extraocular Movements: Extraocular movements intact.     Conjunctiva/sclera: Conjunctivae normal.     Pupils: Pupils are equal, round, and reactive to light.  Neck:     Vascular: No carotid bruit.     Trachea: No tracheal deviation.  Cardiovascular:     Rate and Rhythm: Normal rate and regular rhythm.     Pulses: Normal pulses.     Heart sounds: Normal heart sounds. No murmur. No friction rub. No gallop.   Pulmonary:     Effort: Pulmonary effort is normal. No respiratory distress.     Breath sounds: Normal breath sounds. No stridor. No wheezing, rhonchi or rales.  Chest:     Chest wall: No tenderness.  Abdominal:     General: Bowel sounds are normal. There is no distension.     Palpations: Abdomen is soft. There is no mass.     Tenderness: There is no abdominal tenderness. There is no right CVA tenderness, left CVA tenderness, guarding or rebound.     Hernia: No hernia is present.  Musculoskeletal:        General: No swelling, tenderness, deformity or signs of injury. Normal range of motion.     Right lower leg: No edema.     Left lower leg: No edema.  Lymphadenopathy:     Cervical: No cervical adenopathy.  Skin:    General: Skin is warm and dry.     Capillary Refill: Capillary refill takes less than 2 seconds.     Coloration: Skin is not jaundiced or pale.     Findings: No bruising, erythema, lesion or rash.  Neurological:     General: No focal  deficit present.     Mental Status: He is alert and oriented to person, place, and time.     Cranial Nerves: No cranial nerve deficit.     Sensory: No sensory deficit.     Motor: No weakness.     Coordination: Coordination normal.     Gait: Gait normal.     Deep Tendon Reflexes: Reflexes normal.  Psychiatric:        Mood and Affect: Mood normal.        Behavior: Behavior normal.        Thought Content: Thought content normal.        Judgment: Judgment normal.        Assessment & Plan:  1. Essential hypertension - No change in medications.  - Follow up in one year or sooner if needed - encouraged weight loss through diet and exercise  - CBC with Differential/Platelet - Comprehensive metabolic panel - Lipid panel - TSH  2. Mixed hyperlipidemia - Likely need to be placed back on statin  - CBC with Differential/Platelet - Comprehensive metabolic panel - Lipid panel - TSH  3. Elevated PSA - Encouraged to follow up with Urology  - PSA  4. Chronic right hip pain  - DG Hip Unilat W OR W/O Pelvis 2-3 Views Right; Future - DG Hip Unilat W OR W/O Pelvis 2-3 Views Right   Dorothyann Peng, NP

## 2019-11-12 NOTE — Addendum Note (Signed)
Addended by: Apolinar Junes on: 11/12/2019 12:43 PM   Modules accepted: Orders

## 2019-11-14 ENCOUNTER — Other Ambulatory Visit: Payer: Self-pay | Admitting: Family Medicine

## 2019-11-14 DIAGNOSIS — M1610 Unilateral primary osteoarthritis, unspecified hip: Secondary | ICD-10-CM

## 2019-11-14 DIAGNOSIS — G8929 Other chronic pain: Secondary | ICD-10-CM

## 2019-11-14 DIAGNOSIS — M25551 Pain in right hip: Secondary | ICD-10-CM

## 2019-11-16 ENCOUNTER — Other Ambulatory Visit: Payer: Self-pay | Admitting: Adult Health

## 2019-11-16 DIAGNOSIS — I10 Essential (primary) hypertension: Secondary | ICD-10-CM

## 2019-11-18 ENCOUNTER — Ambulatory Visit: Payer: Medicare Other | Attending: Internal Medicine

## 2019-11-18 DIAGNOSIS — Z23 Encounter for immunization: Secondary | ICD-10-CM

## 2019-11-18 NOTE — Telephone Encounter (Signed)
SENT TO THE PHARMACY BY E-SCRIBE. 

## 2019-11-18 NOTE — Progress Notes (Signed)
   Covid-19 Vaccination Clinic  Name:  John Stone    MRN: [id number] DOB: 1950/12/07  11/18/2019  John Stone was observed post Covid-19 immunization for 15 minutes without incident. He was provided with Vaccine Information Sheet and instruction to access the V-Safe system.   John Stone was instructed to call 911 with any severe reactions post vaccine: Marland Kitchen Difficulty breathing  . Swelling of face and throat  . A fast heartbeat  . A bad rash all over body  . Dizziness and weakness   Immunizations Administered    Name Date Dose VIS Date Route   Pfizer COVID-19 Vaccine 11/18/2019  8:45 AM 0.3 mL 07/26/2019 Intramuscular   Manufacturer: [place]   Lot: [id number]   [place]: [id number]

## 2019-12-05 ENCOUNTER — Telehealth: Payer: Self-pay | Admitting: Adult Health

## 2019-12-05 NOTE — Telephone Encounter (Signed)
We can't send the images but can fax over the report

## 2019-12-05 NOTE — Telephone Encounter (Signed)
Pt states the orthopedic the pt was referred to by [facility] needs a copy of his recent X-ray sent to them.   [facility] in [place], [place] Address: [address], [place], [place] [address] Phone: [phone]   Pt can be reached at [phone]

## 2019-12-06 NOTE — Telephone Encounter (Signed)
Copy of written report sent by fax.  Nothing further needed.

## 2019-12-09 ENCOUNTER — Telehealth: Payer: Self-pay | Admitting: Adult Health

## 2019-12-09 DIAGNOSIS — M1611 Unilateral primary osteoarthritis, right hip: Secondary | ICD-10-CM | POA: Diagnosis not present

## 2019-12-09 NOTE — Telephone Encounter (Signed)
Pt came in and dropped off a Surgical Clearance form form [facility] to be completed by the provider.  Upon completion pt would like to have it mailed to [facility].  Form placed in providers folder for completion.

## 2019-12-10 NOTE — Telephone Encounter (Signed)
Pt scheduled for 12/12/19 per Tommi Rumps.  Paper work to be completed at that time.  Will close note.

## 2019-12-10 NOTE — Telephone Encounter (Signed)
We can use his CPE as surgical clearance

## 2019-12-10 NOTE — Telephone Encounter (Signed)
Do you need to see pt for surgical clearance.  He just had cpx.  Please advise.

## 2019-12-10 NOTE — Telephone Encounter (Signed)
Form received and placed in Cory's folder. 

## 2019-12-11 ENCOUNTER — Other Ambulatory Visit: Payer: Self-pay

## 2019-12-12 ENCOUNTER — Ambulatory Visit (INDEPENDENT_AMBULATORY_CARE_PROVIDER_SITE_OTHER): Payer: Medicare Other | Admitting: Adult Health

## 2019-12-12 ENCOUNTER — Encounter: Payer: Self-pay | Admitting: Adult Health

## 2019-12-12 ENCOUNTER — Ambulatory Visit (INDEPENDENT_AMBULATORY_CARE_PROVIDER_SITE_OTHER)
Admission: RE | Admit: 2019-12-12 | Discharge: 2019-12-12 | Disposition: A | Discharge status: Home / Self Care | Payer: Medicare Other | Source: Ambulatory Visit | Attending: Adult Health | Admitting: Adult Health

## 2019-12-12 VITALS — BP 140/80 | Temp 97.3°F | Wt 193.0 lb

## 2019-12-12 DIAGNOSIS — Z01818 Encounter for other preprocedural examination: Secondary | ICD-10-CM

## 2019-12-12 DIAGNOSIS — M1611 Unilateral primary osteoarthritis, right hip: Secondary | ICD-10-CM | POA: Diagnosis not present

## 2019-12-12 DIAGNOSIS — Z01811 Encounter for preprocedural respiratory examination: Secondary | ICD-10-CM | POA: Diagnosis not present

## 2019-12-12 LAB — URINALYSIS
Bilirubin Urine: NEGATIVE
Hgb urine dipstick: NEGATIVE
Ketones, ur: NEGATIVE
Leukocytes,Ua: NEGATIVE
Nitrite: NEGATIVE
Specific Gravity, Urine: >=1.03 — AB (ref 1.000–1.030)
Total Protein, Urine: NEGATIVE
Urine Glucose: NEGATIVE
Urobilinogen, UA: 0.2 (ref 0.0–1.0)
pH: 6 (ref 5.0–8.0)

## 2019-12-12 LAB — COMPREHENSIVE METABOLIC PANEL
ALT: 19 U/L (ref 0–53)
AST: 15 U/L (ref 0–37)
Albumin: 4.3 g/dL (ref 3.5–5.2)
Alkaline Phosphatase: 53 U/L (ref 39–117)
BUN: 20 mg/dL (ref 6–23)
CO2: 29 mEq/L (ref 19–32)
Calcium: 9.5 mg/dL (ref 8.4–10.5)
Chloride: 102 mEq/L (ref 96–112)
Creatinine, Ser: 1.09 mg/dL (ref 0.40–1.50)
GFR: 80.98 mL/min (ref >60.00)
Glucose, Bld: 93 mg/dL (ref 70–99)
Potassium: 3.7 mEq/L (ref 3.5–5.1)
Sodium: 138 mEq/L (ref 135–145)
Total Bilirubin: 0.6 mg/dL (ref 0.2–1.2)
Total Protein: 7 g/dL (ref 6.0–8.3)

## 2019-12-12 NOTE — Patient Instructions (Signed)
Please stop by the lab on your way out and have blood work done.   Go to the [place] office across from [place] [facility] for the chest xray.

## 2019-12-12 NOTE — Progress Notes (Signed)
Subjective:    Patient ID: John Stone, male    DOB: 05/29/1951, 69 y.o.   MRN: [id number]  HPI  69 year old male who  has a past medical history of Blood in stool, BPH (benign prostatic hyperplasia), Chicken pox, and Hypertension.  He presents to the office today for pre operative clearance. He will be having a right total hip arthroplasty done by Dr. Lyla Glassing. Anesthesia will be provided via spinal.    He is having second thoughts about the surgery. He is not sure he wants to go through with it as his pain " isn't that bad"   His xray on 12/13/2019 showed   IMPRESSION: Advanced osteoarthritic changes of the RIGHT hip joint.  Review of Systems  Constitutional: Negative.   HENT: Negative.   Eyes: Negative.   Respiratory: Negative.   Cardiovascular: Negative.   Gastrointestinal: Negative.   Endocrine: Negative.   Genitourinary: Negative.   Musculoskeletal: Positive for arthralgias and gait problem.  Skin: Negative.   Allergic/Immunologic: Negative.   Hematological: Negative.   Psychiatric/Behavioral: Negative.   All other systems reviewed and are negative.  Past Medical History:  Diagnosis Date  . Blood in stool   . BPH (benign prostatic hyperplasia)   . Chicken pox   . Hypertension     Social History   Socioeconomic History  . Marital status: Married    Spouse name: Not on file  . Number of children: Not on file  . Years of education: Not on file  . Highest education level: Not on file  Occupational History  . Not on file  Tobacco Use  . Smoking status: Never Smoker  . Smokeless tobacco: Never Used  Substance and Sexual Activity  . Alcohol use: No  . Drug use: No  . Sexual activity: Not on file  Other Topics Concern  . Not on file  Social History Narrative   Married   8 kids    6 live locally.       Work in [employer]   He likes to stay active in [employer].    Social Determinants of Health   Financial Resource Strain:   .  Difficulty of Paying Living Expenses:   Food Insecurity:   . Worried About Charity fundraiser in the Last Year:   . Arboriculturist in the Last Year:   Transportation Needs:   . Film/video editor (Medical):   Marland Kitchen Lack of Transportation (Non-Medical):   Physical Activity:   . Days of Exercise per Week:   . Minutes of Exercise per Session:   Stress:   . Feeling of Stress :   Social Connections:   . Frequency of Communication with Friends and Family:   . Frequency of Social Gatherings with Friends and Family:   . Attends Religious Services:   . Active Member of Clubs or Organizations:   . Attends Archivist Meetings:   Marland Kitchen Marital Status:   Intimate Partner Violence:   . Fear of Current or Ex-Partner:   . Emotionally Abused:   Marland Kitchen Physically Abused:   . Sexually Abused:     Past Surgical History:  Procedure Laterality Date  . PROSTATE SURGERY  2007    Family History  Problem Relation Age of Onset  . Hypertension Mother        parent  . Hypertension Maternal Grandfather        grandparent   . Colon cancer Neg Hx   .  Colon polyps Neg Hx   . Esophageal cancer Neg Hx   . Rectal cancer Neg Hx   . Stomach cancer Neg Hx     No Known Allergies  Current Outpatient Medications on File Prior to Visit  Medication Sig Dispense Refill  . amLODipine (NORVASC) 5 MG tablet TAKE 1 TABLET(5 MG) BY MOUTH DAILY 90 tablet 3  . lisinopril-hydrochlorothiazide (ZESTORETIC) 20-25 MG tablet TAKE 1 TABLET BY MOUTH DAILY 90 tablet 1  . simvastatin (ZOCOR) 10 MG tablet Take 1 tablet (10 mg total) by mouth daily. 90 tablet 3   No current facility-administered medications on file prior to visit.    There were no vitals taken for this visit.      Objective:   Physical Exam Vitals and nursing note reviewed.  Constitutional:      General: He is not in acute distress.    Appearance: Normal appearance. He is well-developed and normal weight.  HENT:     Head: Normocephalic and  atraumatic.     Right Ear: Tympanic membrane, ear canal and external ear normal. There is no impacted cerumen.     Left Ear: Tympanic membrane, ear canal and external ear normal. There is no impacted cerumen.     Nose: Nose normal. No congestion or rhinorrhea.     Mouth/Throat:     Mouth: Mucous membranes are moist.     Pharynx: Oropharynx is clear. No oropharyngeal exudate or posterior oropharyngeal erythema.  Eyes:     General:        Right eye: No discharge.        Left eye: No discharge.     Extraocular Movements: Extraocular movements intact.     Conjunctiva/sclera: Conjunctivae normal.     Pupils: Pupils are equal, round, and reactive to light.  Neck:     Vascular: No carotid bruit.     Trachea: No tracheal deviation.  Cardiovascular:     Rate and Rhythm: Normal rate and regular rhythm.     Pulses: Normal pulses.     Heart sounds: Normal heart sounds. No murmur. No friction rub. No gallop.   Pulmonary:     Effort: Pulmonary effort is normal. No respiratory distress.     Breath sounds: Normal breath sounds. No stridor. No wheezing, rhonchi or rales.  Chest:     Chest wall: No tenderness.  Abdominal:     General: Bowel sounds are normal. There is no distension.     Palpations: Abdomen is soft. There is no mass.     Tenderness: There is no abdominal tenderness. There is no right CVA tenderness, left CVA tenderness, guarding or rebound.     Hernia: No hernia is present.  Musculoskeletal:        General: No swelling, tenderness, deformity or signs of injury. Normal range of motion.     Right lower leg: No edema.     Left lower leg: No edema.  Lymphadenopathy:     Cervical: No cervical adenopathy.  Skin:    General: Skin is warm and dry.     Capillary Refill: Capillary refill takes less than 2 seconds.     Coloration: Skin is not jaundiced or pale.     Findings: No bruising, erythema, lesion or rash.  Neurological:     General: No focal deficit present.     Mental Status:  He is alert and oriented to person, place, and time.     Cranial Nerves: No cranial nerve deficit.  Sensory: No sensory deficit.     Motor: No weakness.     Coordination: Coordination normal.     Gait: Gait normal.     Deep Tendon Reflexes: Reflexes normal.  Psychiatric:        Mood and Affect: Mood normal.        Behavior: Behavior normal.        Thought Content: Thought content normal.        Judgment: Judgment normal.       Assessment & Plan:  1. Pre-operative clearance - We discussed pros and cons of hip surgery. Right now he is a good surgical candidate and we cannot guarantee that in the future. His pain is not going to improve and will likely get worse with age. Ultimately, the surgery is up to him.   - EKG 12-Lead- Sinus  Rhythm  -  Nonspecific T-abnormality, Rate 66. Consistent with previous EKG's - DG Chest 2 View; Future - Urinalysis - Comprehensive metabolic panel   Dorothyann Peng, NP

## 2020-06-09 ENCOUNTER — Ambulatory Visit (INDEPENDENT_AMBULATORY_CARE_PROVIDER_SITE_OTHER): Payer: Medicare Other | Admitting: Adult Health

## 2020-06-09 ENCOUNTER — Other Ambulatory Visit: Payer: Self-pay

## 2020-06-09 ENCOUNTER — Encounter: Payer: Self-pay | Admitting: Adult Health

## 2020-06-09 VITALS — BP 134/84 | HR 57 | Temp 98.0°F | Wt 192.4 lb

## 2020-06-09 DIAGNOSIS — Z23 Encounter for immunization: Secondary | ICD-10-CM

## 2020-06-09 DIAGNOSIS — I10 Essential (primary) hypertension: Secondary | ICD-10-CM | POA: Diagnosis not present

## 2020-06-09 MED ORDER — LISINOPRIL-HYDROCHLOROTHIAZIDE 20-25 MG PO TABS: 1.0000 | ORAL_TABLET | Freq: Every day | ORAL | 1 refills | Status: DC

## 2020-06-09 NOTE — Progress Notes (Signed)
Subjective:    Patient ID: John Stone, male    DOB: 1951-06-17, 69 y.o.   MRN: [id number]  HPI  69 year old male who  has a past medical history of Blood in stool, BPH (benign prostatic hyperplasia), Chicken pox, and Hypertension.   He presents to the office today for follow up regarding hypertension. He is currently prescribed lisinopril/HCTZ 20-25 mg and Norvasc 5 mg. He denies dizziness, lightheadedness, blurred vision. He needs a refill of lisinopril/HCTZ  BP Readings from Last 3 Encounters:  06/09/20 134/84  12/12/19 140/80  11/12/19 130/88   He is going to get his influenza vaccination and pneumovax 23 today   Review of Systems See HPI   Past Medical History:  Diagnosis Date   Blood in stool    BPH (benign prostatic hyperplasia)    Chicken pox    Hypertension     Social History   Socioeconomic History   Marital status: Married    Spouse name: Not on file   Number of children: Not on file   Years of education: Not on file   Highest education level: Not on file  Occupational History   Not on file  Tobacco Use   Smoking status: Never Smoker   Smokeless tobacco: Never Used  Substance and Sexual Activity   Alcohol use: No   Drug use: No   Sexual activity: Not on file  Other Topics Concern   Not on file  Social History Narrative   Married   8 kids    6 live locally.       Work in [employer]   He likes to stay active in [employer].    Social Determinants of Health   Financial Resource Strain:    Difficulty of Paying Living Expenses: Not on file  Food Insecurity:    Worried About Charity fundraiser in the Last Year: Not on file   [employer] of Food in the Last Year: Not on file  Transportation Needs:    Lack of Transportation (Medical): Not on file   Lack of Transportation (Non-Medical): Not on file  Physical Activity:    Days of Exercise per Week: Not on file   Minutes of Exercise per Session: Not on file    Stress:    Feeling of Stress : Not on file  Social Connections:    Frequency of Communication with Friends and Family: Not on file   Frequency of Social Gatherings with Friends and Family: Not on file   Attends Religious Services: Not on file   Active Member of Clubs or Organizations: Not on file   Attends Archivist Meetings: Not on file   Marital Status: Not on file  Intimate Partner Violence:    Fear of Current or Ex-Partner: Not on file   Emotionally Abused: Not on file   Physically Abused: Not on file   Sexually Abused: Not on file    Past Surgical History:  Procedure Laterality Date   PROSTATE SURGERY  2007    Family History  Problem Relation Age of Onset   Hypertension Mother        parent   Hypertension Maternal Grandfather        grandparent    Colon cancer Neg Hx    Colon polyps Neg Hx    Esophageal cancer Neg Hx    Rectal cancer Neg Hx    Stomach cancer Neg Hx     No Known Allergies  Current  Outpatient Medications on File Prior to Visit  Medication Sig Dispense Refill   amLODipine (NORVASC) 5 MG tablet TAKE 1 TABLET(5 MG) BY MOUTH DAILY 90 tablet 3   lisinopril-hydrochlorothiazide (ZESTORETIC) 20-25 MG tablet TAKE 1 TABLET BY MOUTH DAILY 90 tablet 1   simvastatin (ZOCOR) 10 MG tablet Take 1 tablet (10 mg total) by mouth daily. 90 tablet 3   No current facility-administered medications on file prior to visit.    BP 134/84 (BP Location: Left Arm, Patient Position: Sitting, Cuff Size: Large)    Pulse (!) 57    Temp 98 F (36.7 C) (Oral)    Wt 192 lb 6.4 oz (87.3 kg)    SpO2 98%    BMI 33.03 kg/m       Objective:   Physical Exam Vitals and nursing note reviewed.  Constitutional:      Appearance: Normal appearance.  Cardiovascular:     Rate and Rhythm: Normal rate and regular rhythm.     Pulses: Normal pulses.     Heart sounds: Normal heart sounds.  Pulmonary:     Effort: Pulmonary effort is normal.     Breath  sounds: Normal breath sounds.  Musculoskeletal:     Right lower leg: No edema.     Left lower leg: No edema.  Skin:    General: Skin is warm.  Neurological:     General: No focal deficit present.     Mental Status: He is alert and oriented to person, place, and time.  Psychiatric:        Mood and Affect: Mood normal.        Behavior: Behavior normal.        Thought Content: Thought content normal.        Judgment: Judgment normal.       Assessment & Plan:

## 2020-06-09 NOTE — Addendum Note (Signed)
Addended by: Alverda Skeans on: 06/09/2020 08:55 AM   Modules accepted: Orders

## 2020-06-09 NOTE — Patient Instructions (Signed)
It was great seeing you today   I have refilled your blood pressure medication   Please follow up after March 30th for your physical

## 2020-07-07 ENCOUNTER — Telehealth: Payer: Self-pay | Admitting: Adult Health

## 2020-07-07 NOTE — Telephone Encounter (Signed)
Called patient to schedule and AWVS. Patient requested that I call him on next Monday 07/13/2020

## 2020-07-16 NOTE — Telephone Encounter (Signed)
Patient scheduled for 07/29/2020 @ 9:00 am for AWV

## 2020-07-29 ENCOUNTER — Ambulatory Visit: Payer: Medicare Other

## 2020-08-28 IMAGING — DX DG CHEST 2V
2 series · 2 of 2 positions shown · non-contrast
Comparison: 08/30/2016

CLINICAL DATA: Pre operative respiratory exam.

EXAM:
CHEST - 2 VIEW

[chest pa]
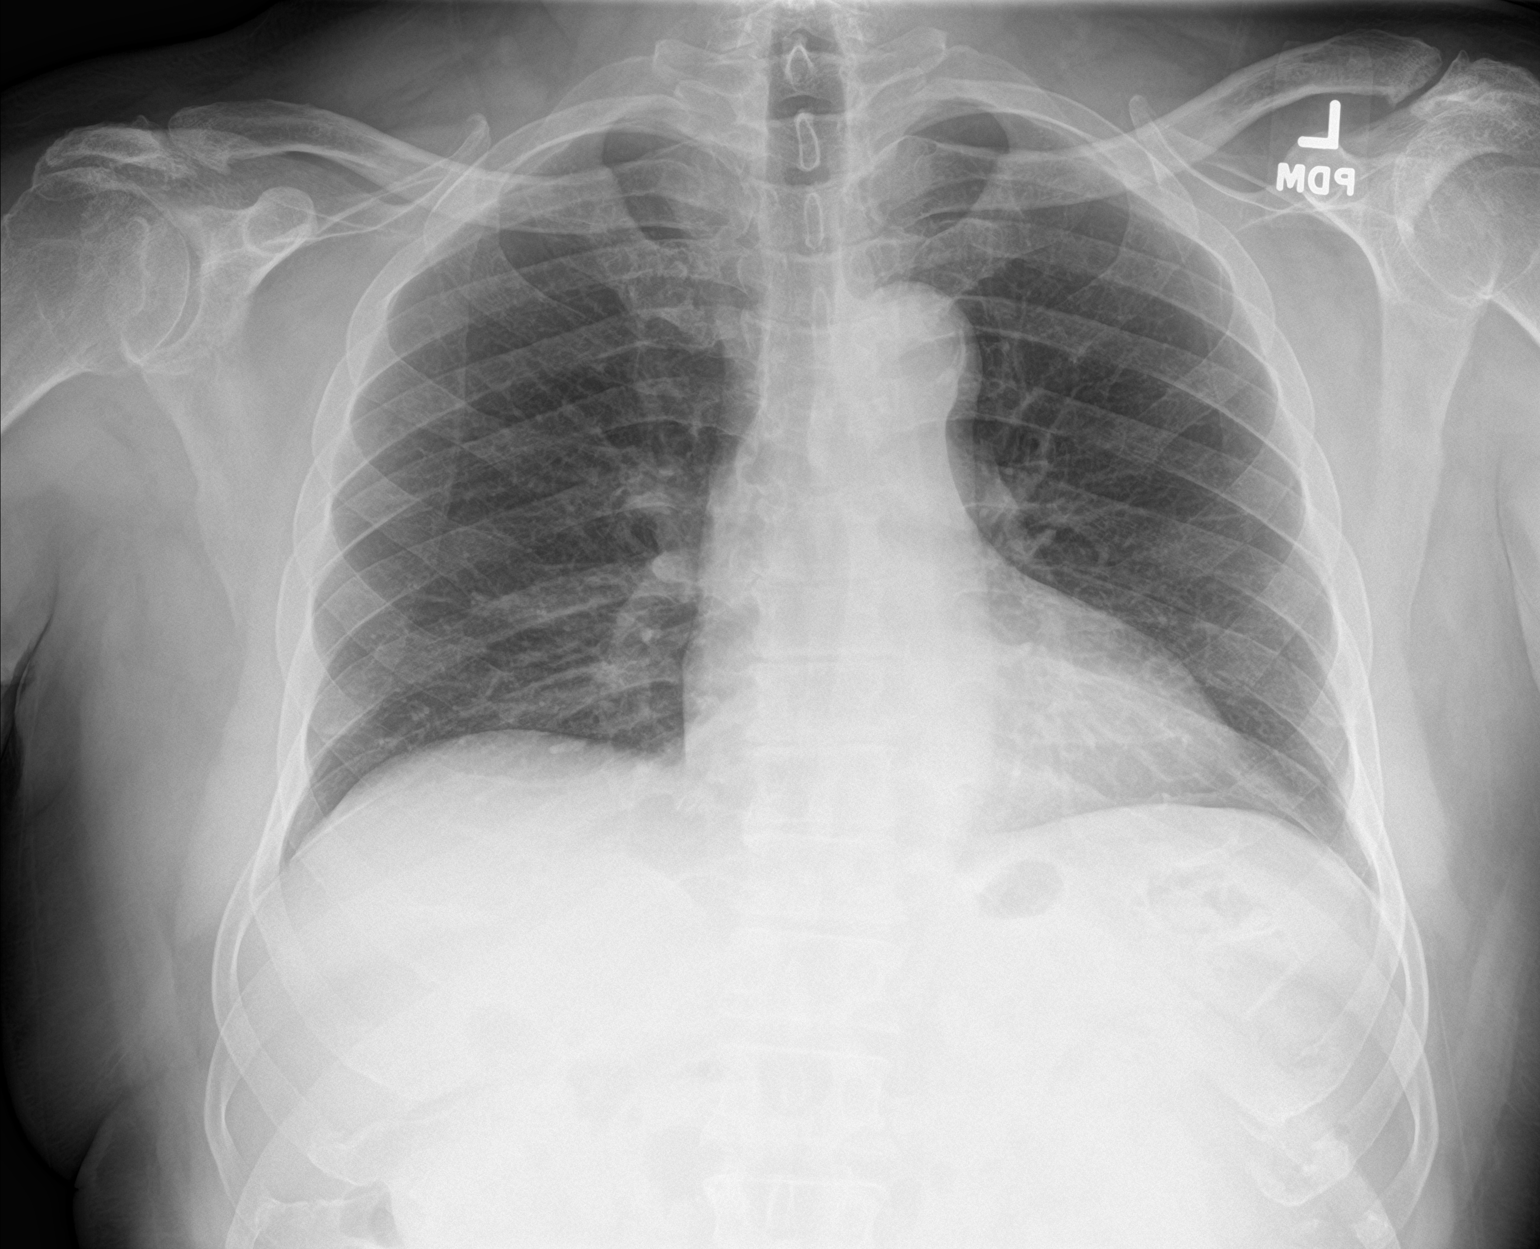

[chest lat]
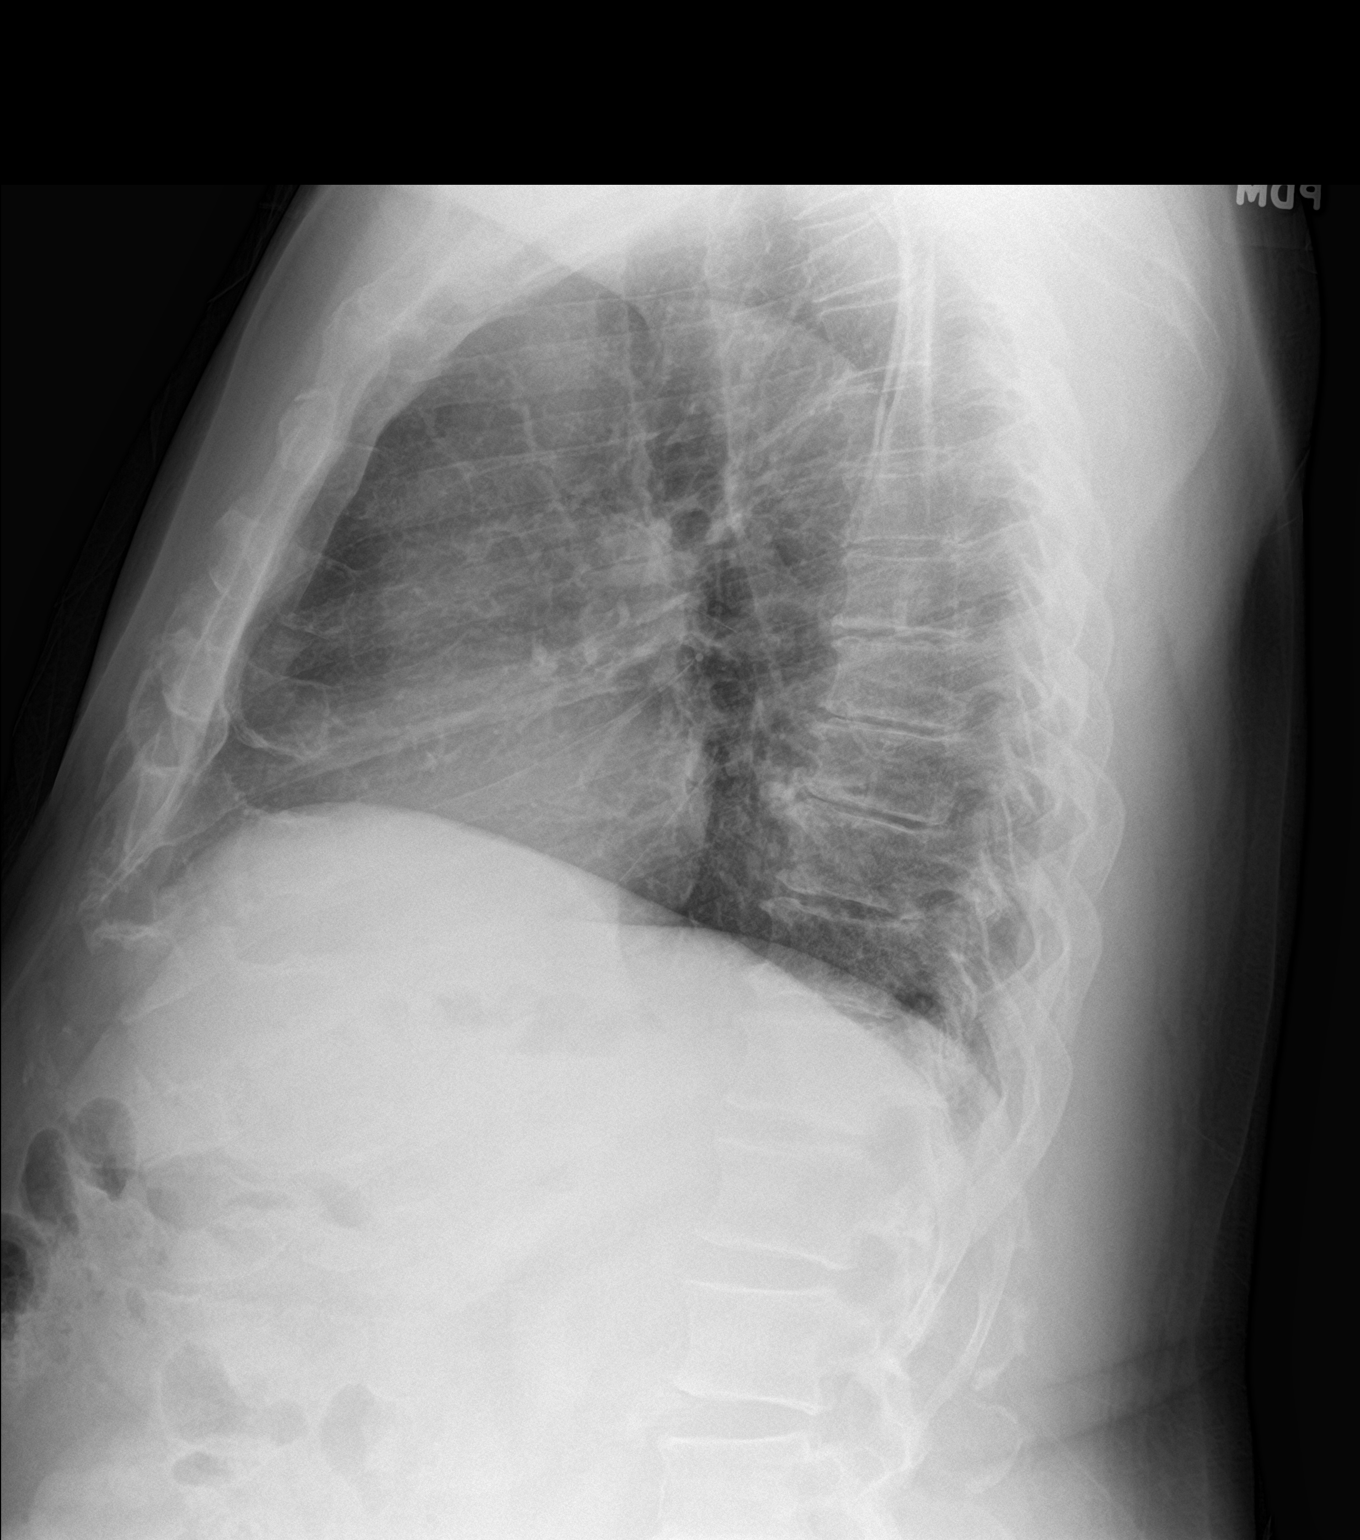

[2 of 2 positions shown; findings below may reference images not displayed]

FINDINGS: The heart size and mediastinal contours are within normal limits.
Both lungs are clear. Chronic degenerative changes of both
shoulders, likely representing rotator cuff disease.
IMPRESSION: No acute abnormalities.  Clear lungs.

## 2020-09-30 ENCOUNTER — Ambulatory Visit (INDEPENDENT_AMBULATORY_CARE_PROVIDER_SITE_OTHER): Payer: Medicare Other

## 2020-09-30 ENCOUNTER — Other Ambulatory Visit: Payer: Self-pay

## 2020-09-30 VITALS — BP 142/80 | HR 66 | Temp 98.0°F | Ht 64.0 in | Wt 195.5 lb

## 2020-09-30 DIAGNOSIS — Z0000 Encounter for general adult medical examination without abnormal findings: Secondary | ICD-10-CM

## 2020-09-30 NOTE — Progress Notes (Signed)
Subjective:   John Stone is a 70 y.o. male who presents for Medicare Annual/Subsequent preventive examination.  Review of Systems    N/A  Cardiac Risk Factors include: advanced age (>57men, >58 women);male gender;hypertension;dyslipidemia     Objective:    Today's Vitals   09/30/20 0921  BP: (!) 142/80  Pulse: 66  Temp: 98 F (36.7 C)  TempSrc: Oral  SpO2: 99%  Weight: 195 lb 8 oz (88.7 kg)  Height: 5\' 4"  (1.626 m)   Body mass index is 33.56 kg/m.  Advanced Directives 09/30/2020 10/11/2017 06/27/2016 04/01/2016 06/29/2015  Does Patient Have a Medical Advance Directive? No No No No No  Would patient like information on creating a medical advance directive? No - Patient declined - No - patient declined information - No - patient declined information    Current Medications (verified) Outpatient Encounter Medications as of 09/30/2020  Medication Sig  . amLODipine (NORVASC) 5 MG tablet TAKE 1 TABLET(5 MG) BY MOUTH DAILY  . lisinopril-hydrochlorothiazide (ZESTORETIC) 20-25 MG tablet Take 1 tablet by mouth daily.  . simvastatin (ZOCOR) 10 MG tablet Take 1 tablet (10 mg total) by mouth daily.   No facility-administered encounter medications on file as of 09/30/2020.    Allergies (verified) Patient has no known allergies.   History: Past Medical History:  Diagnosis Date  . Blood in stool   . BPH (benign prostatic hyperplasia)   . Chicken pox   . Hypertension    Past Surgical History:  Procedure Laterality Date  . PROSTATE SURGERY  2007   Family History  Problem Relation Age of Onset  . Hypertension Mother        parent  . Hypertension Maternal Grandfather        grandparent   . Colon cancer Neg Hx   . Colon polyps Neg Hx   . Esophageal cancer Neg Hx   . Rectal cancer Neg Hx   . Stomach cancer Neg Hx    Social History   Socioeconomic History  . Marital status: Married    Spouse name: Not on file  . Number of children: Not on file  . Years of  education: Not on file  . Highest education level: Not on file  Occupational History  . Not on file  Tobacco Use  . Smoking status: Never Smoker  . Smokeless tobacco: Never Used  Substance and Sexual Activity  . Alcohol use: No  . Drug use: No  . Sexual activity: Not on file  Other Topics Concern  . Not on file  Social History Narrative   Married   8 kids    6 live locally.       Work in [employer]   He likes to stay active in [employer].    Social Determinants of Health   Financial Resource Strain: Low Risk   . Difficulty of Paying Living Expenses: Not hard at all  Food Insecurity: No Food Insecurity  . Worried About Charity fundraiser in the Last Year: Never true  . Ran Out of Food in the Last Year: Never true  Transportation Needs: No Transportation Needs  . Lack of Transportation (Medical): No  . Lack of Transportation (Non-Medical): No  Physical Activity: Inactive  . Days of Exercise per Week: 0 days  . Minutes of Exercise per Session: 0 min  Stress: No Stress Concern Present  . Feeling of Stress : Not at all  Social Connections: Socially Integrated  . Frequency of Communication with  Friends and Family: Three times a week  . Frequency of Social Gatherings with Friends and Family: More than three times a week  . Attends Religious Services: More than 4 times per year  . Active Member of Clubs or Organizations: Yes  . Attends Archivist Meetings: More than 4 times per year  . Marital Status: Married    Tobacco Counseling Counseling given: Not Answered   Clinical Intake:  Pre-visit preparation completed: Yes  Pain : No/denies pain     Nutritional Risks: None Diabetes: No  How often do you need to have someone help you when you read instructions, pamphlets, or other written materials from your doctor or pharmacy?: 1 - Never What is the last grade level you completed in school?: 12th grade  Diabetic?No  Interpreter Needed?:  No  Information entered by :: [place] of Daily Living In your present state of health, do you have any difficulty performing the following activities: 09/30/2020  Hearing? N  Vision? N  Difficulty concentrating or making decisions? N  Walking or climbing stairs? N  Dressing or bathing? N  Doing errands, shopping? N  Preparing Food and eating ? N  Using the Toilet? N  In the past six months, have you accidently leaked urine? N  Do you have problems with loss of bowel control? N  Managing your Medications? N  Managing your Finances? N  Housekeeping or managing your Housekeeping? N  Some recent data might be hidden    Patient Care Team: John Peng, NP as PCP - General (Family Medicine) Kathie Rhodes, MD (Inactive) as Consulting Physician (Urology) [facility] (Specialist)  Indicate any recent Medical Services you may have received from other than [facility] providers in the past year (date may be approximate).     Assessment:   This is a routine wellness examination for [name].  Hearing/Vision screen  Hearing Screening   125Hz  250Hz  500Hz  1000Hz  2000Hz  3000Hz  4000Hz  6000Hz  8000Hz   Right ear:           Left ear:           Vision Screening Comments: Has not seen and eye doctor in several years   Dietary issues and exercise activities discussed: Current Exercise Habits: The patient has a physically strenuous job, but has no regular exercise apart from work.  Goals    . Patient Stated     Patient plans to keep working    . Patient stated     I want to make more money and get into real estate!      Depression Screen PHQ 2/9 Scores 09/30/2020 06/09/2020 10/16/2018 10/11/2017 10/11/2017 09/28/2017  PHQ - 2 Score 0 0 0 0 0 0    Fall Risk Fall Risk  09/30/2020 06/09/2020 10/16/2018 10/11/2017 10/11/2017  Falls in the past year? 0 0 0 No No  Number falls in past yr: 0 0 - - -  Injury with Fall? 0 0 - - -  Risk for fall due to : No Fall Risks No Fall Risks - - -   Follow up Falls evaluation completed;Falls prevention discussed Falls evaluation completed - - -    FALL RISK PREVENTION PERTAINING TO THE HOME:  Any stairs in or around the home? No  If so, are there any without handrails? No  Home free of loose throw rugs in walkways, pet beds, electrical cords, etc? Yes  Adequate lighting in your home to reduce risk of falls? Yes   ASSISTIVE DEVICES UTILIZED  TO PREVENT FALLS:  Life alert? No  Use of a cane, walker or w/c? No  Grab bars in the bathroom? No  Shower chair or bench in shower? No  Elevated toilet seat or a handicapped toilet? No   TIMED UP AND GO:  Was the test performed? Yes .  Length of time to ambulate 10 feet: 4  sec.   Gait steady and fast without use of assistive device  Cognitive Function: Normal cognitive status assessed by direct observation by this Nurse Health Advisor. No abnormalities found.   MMSE - Mini Mental State Exam 10/11/2017  Not completed: (No Data)        Immunizations Immunization History  Administered Date(s) Administered  . Fluad Quad(high Dose 65+) 05/07/2019, 06/09/2020  . Influenza, High Dose Seasonal PF 09/28/2017, 06/12/2018  . PFIZER(Purple Top)SARS-COV-2 Vaccination 10/24/2019, 11/18/2019  . Pneumococcal Conjugate-13 10/16/2018  . Pneumococcal Polysaccharide-23 06/09/2020    TDAP status: Due, Education has been provided regarding the importance of this vaccine. Advised may receive this vaccine at local pharmacy or Health Dept. Aware to provide a copy of the vaccination record if obtained from local pharmacy or Health Dept. Verbalized acceptance and understanding.  Flu Vaccine status: Up to date  Pneumococcal vaccine status: Up to date  Covid-19 vaccine status: Completed vaccines  Qualifies for Shingles Vaccine? Yes   Zostavax completed No   Shingrix Completed?: No.    Education has been provided regarding the importance of this vaccine. Patient has been advised to call insurance  company to determine out of pocket expense if they have not yet received this vaccine. Advised may also receive vaccine at local pharmacy or Health Dept. Verbalized acceptance and understanding.  Screening Tests Health Maintenance  Topic Date Due  . Hepatitis C Screening  Never done  . TETANUS/TDAP  Never done  . Fecal DNA (Cologuard)  Never done  . COVID-19 Vaccine (3 - Booster for Pfizer series) 05/19/2020  . INFLUENZA VACCINE  Completed  . PNA vac Low Risk Adult  Completed    Health Maintenance  Health Maintenance Due  Topic Date Due  . Hepatitis C Screening  Never done  . TETANUS/TDAP  Never done  . Fecal DNA (Cologuard)  Never done  . COVID-19 Vaccine (3 - Booster for Pfizer series) 05/19/2020    Colonoscopy screening: Patient declined  Lung Cancer Screening: (Low Dose CT Chest recommended if Age 19-80 years, 30 pack-year currently smoking OR have quit w/in 15years.) does notN/A  qualify.   Lung Cancer Screening Referral: N/A   Additional Screening:  Hepatitis C Screening: does qualify;   Vision Screening: Recommended annual ophthalmology exams for early detection of glaucoma and other disorders of the eye. Is the patient up to date with their annual eye exam?  No  Who is the provider or what is the name of the office in which the patient attends annual eye exams? N/A  If pt is not established with a provider, would they like to be referred to a provider to establish care? No .   Dental Screening: Recommended annual dental exams for proper oral hygiene  Community Resource Referral / Chronic Care Management: CRR required this visit?  No   CCM required this visit?  No      Plan:     I have personally reviewed and noted the following in the patient's chart:   . Medical and social history . Use of alcohol, tobacco or illicit drugs  . Current medications and supplements . Functional ability  and status . Nutritional status . Physical activity . Advanced  directives . List of other physicians . Hospitalizations, surgeries, and ER visits in previous 12 months . Vitals . Screenings to include cognitive, depression, and falls . Referrals and appointments  In addition, I have reviewed and discussed with patient certain preventive protocols, quality metrics, and best practice recommendations. A written personalized care plan for preventive services as well as general preventive health recommendations were provided to patient.     Ofilia Neas, LPN   [id number]   Nurse Notes: None

## 2020-09-30 NOTE — Patient Instructions (Signed)
[name] , Thank you for taking time to come for your Medicare Wellness Visit. I appreciate your ongoing commitment to your health goals. Please review the following plan we discussed and let me know if I can assist you in the future.   Screening recommendations/referrals: Colonoscopy: Patient declined  Recommended yearly ophthalmology/optometry visit for glaucoma screening and checkup Recommended yearly dental visit for hygiene and checkup  Vaccinations: Influenza vaccine: Up to date, next due fall 2022  Pneumococcal vaccine: Completed series  Tdap vaccine: Currently due, you may await and injury to receive  Shingles vaccine: Currently due for Shingrix, if you wish to receive we recommend that you receive at your local pharmacy as it is less expensive     Advanced directives: Advance directive discussed with you today. Even though you declined this today please call our office should you change your mind and we can give you the proper paperwork for you to fill out.   Conditions/risks identified: None   Next appointment: 11/10/2020 @ 7:30 am with Dorothyann Peng, NP  Preventive Care 71 Years and Older, Male Preventive care refers to lifestyle choices and visits with your health care provider that can promote health and wellness. What does preventive care include?  A yearly physical exam. This is also called an annual well check.  Dental exams once or twice a year.  Routine eye exams. Ask your health care provider how often you should have your eyes checked.  Personal lifestyle choices, including:  Daily care of your teeth and gums.  Regular physical activity.  Eating a healthy diet.  Avoiding tobacco and drug use.  Limiting alcohol use.  Practicing safe sex.  Taking low doses of aspirin every day.  Taking vitamin and mineral supplements as recommended by your health care provider. What happens during an annual well check? The services and screenings done by your health  care provider during your annual well check will depend on your age, overall health, lifestyle risk factors, and family history of disease. Counseling  Your health care provider may ask you questions about your:  Alcohol use.  Tobacco use.  Drug use.  Emotional well-being.  Home and relationship well-being.  Sexual activity.  Eating habits.  History of falls.  Memory and ability to understand (cognition).  Work and work Statistician. Screening  You may have the following tests or measurements:  Height, weight, and BMI.  Blood pressure.  Lipid and cholesterol levels. These may be checked every 5 years, or more frequently if you are over 56 years old.  Skin check.  Lung cancer screening. You may have this screening every year starting at age 71 if you have a 30-pack-year history of smoking and currently smoke or have quit within the past 15 years.  Fecal occult blood test (FOBT) of the stool. You may have this test every year starting at age 71.  Flexible sigmoidoscopy or colonoscopy. You may have a sigmoidoscopy every 5 years or a colonoscopy every 10 years starting at age 82.  Prostate cancer screening. Recommendations will vary depending on your family history and other risks.  Hepatitis C blood test.  Hepatitis B blood test.  Sexually transmitted disease (STD) testing.  Diabetes screening. This is done by checking your blood sugar (glucose) after you have not eaten for a while (fasting). You may have this done every 1-3 years.  Abdominal aortic aneurysm (AAA) screening. You may need this if you are a current or former smoker.  Osteoporosis. You may be screened starting  at age 43 if you are at high risk. Talk with your health care provider about your test results, treatment options, and if necessary, the need for more tests. Vaccines  Your health care provider may recommend certain vaccines, such as:  Influenza vaccine. This is recommended every  year.  Tetanus, diphtheria, and acellular pertussis (Tdap, Td) vaccine. You may need a Td booster every 10 years.  Zoster vaccine. You may need this after age 51.  Pneumococcal 13-valent conjugate (PCV13) vaccine. One dose is recommended after age 42.  Pneumococcal polysaccharide (PPSV23) vaccine. One dose is recommended after age 45. Talk to your health care provider about which screenings and vaccines you need and how often you need them. This information is not intended to replace advice given to you by your health care provider. Make sure you discuss any questions you have with your health care provider. Document Released: 08/28/2015 Document Revised: 04/20/2016 Document Reviewed: 06/02/2015 Elsevier Interactive Patient Education  2017 [place] Prevention in the Home Falls can cause injuries. They can happen to people of all ages. There are many things you can do to make your home safe and to help prevent falls. What can I do on the outside of my home?  Regularly fix the edges of walkways and driveways and fix any cracks.  Remove anything that might make you trip as you walk through a door, such as a raised step or threshold.  Trim any bushes or trees on the path to your home.  Use bright outdoor lighting.  Clear any walking paths of anything that might make someone trip, such as rocks or tools.  Regularly check to see if handrails are loose or broken. Make sure that both sides of any steps have handrails.  Any raised decks and porches should have guardrails on the edges.  Have any leaves, snow, or ice cleared regularly.  Use sand or salt on walking paths during winter.  Clean up any spills in your garage right away. This includes oil or grease spills. What can I do in the bathroom?  Use night lights.  Install grab bars by the toilet and in the tub and shower. Do not use towel bars as grab bars.  Use non-skid mats or decals in the tub or shower.  If you  need to sit down in the shower, use a plastic, non-slip stool.  Keep the floor dry. Clean up any water that spills on the floor as soon as it happens.  Remove soap buildup in the tub or shower regularly.  Attach bath mats securely with double-sided non-slip rug tape.  Do not have throw rugs and other things on the floor that can make you trip. What can I do in the bedroom?  Use night lights.  Make sure that you have a light by your bed that is easy to reach.  Do not use any sheets or blankets that are too big for your bed. They should not hang down onto the floor.  Have a firm chair that has side arms. You can use this for support while you get dressed.  Do not have throw rugs and other things on the floor that can make you trip. What can I do in the kitchen?  Clean up any spills right away.  Avoid walking on wet floors.  Keep items that you use a lot in easy-to-reach places.  If you need to reach something above you, use a strong step stool that has a grab bar.  Keep electrical cords out of the way.  Do not use floor polish or wax that makes floors slippery. If you must use wax, use non-skid floor wax.  Do not have throw rugs and other things on the floor that can make you trip. What can I do with my stairs?  Do not leave any items on the stairs.  Make sure that there are handrails on both sides of the stairs and use them. Fix handrails that are broken or loose. Make sure that handrails are as long as the stairways.  Check any carpeting to make sure that it is firmly attached to the stairs. Fix any carpet that is loose or worn.  Avoid having throw rugs at the top or bottom of the stairs. If you do have throw rugs, attach them to the floor with carpet tape.  Make sure that you have a light switch at the top of the stairs and the bottom of the stairs. If you do not have them, ask someone to add them for you. What else can I do to help prevent falls?  Wear shoes  that:  Do not have high heels.  Have rubber bottoms.  Are comfortable and fit you well.  Are closed at the toe. Do not wear sandals.  If you use a stepladder:  Make sure that it is fully opened. Do not climb a closed stepladder.  Make sure that both sides of the stepladder are locked into place.  Ask someone to hold it for you, if possible.  Clearly mark and make sure that you can see:  Any grab bars or handrails.  First and last steps.  Where the edge of each step is.  Use tools that help you move around (mobility aids) if they are needed. These include:  Canes.  Walkers.  Scooters.  Crutches.  Turn on the lights when you go into a dark area. Replace any light bulbs as soon as they burn out.  Set up your furniture so you have a clear path. Avoid moving your furniture around.  If any of your floors are uneven, fix them.  If there are any pets around you, be aware of where they are.  Review your medicines with your doctor. Some medicines can make you feel dizzy. This can increase your chance of falling. Ask your doctor what other things that you can do to help prevent falls. This information is not intended to replace advice given to you by your health care provider. Make sure you discuss any questions you have with your health care provider. Document Released: 05/28/2009 Document Revised: 01/07/2016 Document Reviewed: 09/05/2014 Elsevier Interactive Patient Education  2017 [employer].

## 2020-10-11 ENCOUNTER — Other Ambulatory Visit: Payer: Self-pay | Admitting: Adult Health

## 2020-10-11 DIAGNOSIS — I10 Essential (primary) hypertension: Secondary | ICD-10-CM

## 2020-11-10 ENCOUNTER — Other Ambulatory Visit: Payer: Self-pay | Admitting: Adult Health

## 2020-11-10 ENCOUNTER — Other Ambulatory Visit: Payer: Self-pay

## 2020-11-10 ENCOUNTER — Ambulatory Visit (INDEPENDENT_AMBULATORY_CARE_PROVIDER_SITE_OTHER): Payer: Medicare Other | Admitting: Adult Health

## 2020-11-10 ENCOUNTER — Encounter: Payer: Self-pay | Admitting: Adult Health

## 2020-11-10 ENCOUNTER — Encounter: Payer: Self-pay | Admitting: *Deleted

## 2020-11-10 VITALS — BP 124/80 | Ht 64.0 in | Wt 191.5 lb

## 2020-11-10 DIAGNOSIS — Z0000 Encounter for general adult medical examination without abnormal findings: Secondary | ICD-10-CM | POA: Diagnosis not present

## 2020-11-10 DIAGNOSIS — Z1211 Encounter for screening for malignant neoplasm of colon: Secondary | ICD-10-CM

## 2020-11-10 DIAGNOSIS — Z1159 Encounter for screening for other viral diseases: Secondary | ICD-10-CM | POA: Diagnosis not present

## 2020-11-10 DIAGNOSIS — R972 Elevated prostate specific antigen [PSA]: Secondary | ICD-10-CM

## 2020-11-10 DIAGNOSIS — I10 Essential (primary) hypertension: Secondary | ICD-10-CM

## 2020-11-10 DIAGNOSIS — E782 Mixed hyperlipidemia: Secondary | ICD-10-CM | POA: Diagnosis not present

## 2020-11-10 DIAGNOSIS — M1610 Unilateral primary osteoarthritis, unspecified hip: Secondary | ICD-10-CM | POA: Diagnosis not present

## 2020-11-10 LAB — LIPID PANEL
Cholesterol: 181 mg/dL (ref 0–200)
HDL: 47.1 mg/dL (ref >39.00)
LDL Cholesterol: 123 mg/dL — ABNORMAL HIGH (ref 0–99)
NonHDL: 133.95
Total CHOL/HDL Ratio: 4
Triglycerides: 54 mg/dL (ref 0.0–149.0)
VLDL: 10.8 mg/dL (ref 0.0–40.0)

## 2020-11-10 LAB — COMPREHENSIVE METABOLIC PANEL
ALT: 17 U/L (ref 0–53)
AST: 15 U/L (ref 0–37)
Albumin: 4.3 g/dL (ref 3.5–5.2)
Alkaline Phosphatase: 55 U/L (ref 39–117)
BUN: 16 mg/dL (ref 6–23)
CO2: 31 mEq/L (ref 19–32)
Calcium: 9.9 mg/dL (ref 8.4–10.5)
Chloride: 102 mEq/L (ref 96–112)
Creatinine, Ser: 1.05 mg/dL (ref 0.40–1.50)
GFR: 71.83 mL/min (ref >60.00)
Glucose, Bld: 96 mg/dL (ref 70–99)
Potassium: 4.5 mEq/L (ref 3.5–5.1)
Sodium: 139 mEq/L (ref 135–145)
Total Bilirubin: 0.7 mg/dL (ref 0.2–1.2)
Total Protein: 7.4 g/dL (ref 6.0–8.3)

## 2020-11-10 LAB — CBC WITH DIFFERENTIAL/PLATELET
Basophils Absolute: 0 10*3/uL (ref 0.0–0.1)
Basophils Relative: 0.6 % (ref 0.0–3.0)
Eosinophils Absolute: 0.2 10*3/uL (ref 0.0–0.7)
Eosinophils Relative: 3.2 % (ref 0.0–5.0)
HCT: 44.5 % (ref 39.0–52.0)
Hemoglobin: 14.8 g/dL (ref 13.0–17.0)
Lymphocytes Relative: 29.1 % (ref 12.0–46.0)
Lymphs Abs: 1.8 10*3/uL (ref 0.7–4.0)
MCHC: 33.3 g/dL (ref 30.0–36.0)
MCV: 85.1 fl (ref 78.0–100.0)
Monocytes Absolute: 0.6 10*3/uL (ref 0.1–1.0)
Monocytes Relative: 9.7 % (ref 3.0–12.0)
Neutro Abs: 3.5 10*3/uL (ref 1.4–7.7)
Neutrophils Relative %: 57.4 % (ref 43.0–77.0)
Platelets: 202 10*3/uL (ref 150.0–400.0)
RBC: 5.23 Mil/uL (ref 4.22–5.81)
RDW: 14.3 % (ref 11.5–15.5)
WBC: 6.2 10*3/uL (ref 4.0–10.5)

## 2020-11-10 LAB — PSA: PSA: 60.69 ng/mL — ABNORMAL HIGH (ref 0.10–4.00)

## 2020-11-10 LAB — TSH: TSH: 1.36 u[IU]/mL (ref 0.35–4.50)

## 2020-11-10 MED ORDER — SIMVASTATIN 20 MG PO TABS: 10.0000 mg | ORAL_TABLET | Freq: Every day | ORAL | 3 refills | Status: DC

## 2020-11-10 MED ORDER — LISINOPRIL-HYDROCHLOROTHIAZIDE 20-25 MG PO TABS: 1.0000 | ORAL_TABLET | Freq: Every day | ORAL | 3 refills | Status: DC

## 2020-11-10 MED ORDER — AMLODIPINE BESYLATE 5 MG PO TABS: 5.0000 mg | ORAL_TABLET | Freq: Every day | ORAL | 3 refills | Status: DC

## 2020-11-10 NOTE — Progress Notes (Signed)
Subjective:    Patient ID: John Stone, male    DOB: 1951-03-25, 70 y.o.   MRN: [id number]  HPI Patient presents for yearly preventative medicine examination. He is a pleasant 70 year old male who  has a past medical history of Blood in stool, BPH (benign prostatic hyperplasia), Chicken pox, and Hypertension.  Hypertension -he takes lisinopril/hydrochlorothiazide 20-25 mg and Norvasc 5 mg daily.  He denies dizziness, lightheadedness, chest pain, or shortness of breath. BP Readings from Last 3 Encounters:  11/10/20 124/80  09/30/20 (!) 142/80  06/09/20 134/84   Hyperlipidemia -currently prescribed Zocor 10 mg daily. He denies myalgia or fatigue  Lab Results  Component Value Date   CHOL 181 11/12/2019   HDL 39.80 11/12/2019   LDLCALC 128 (H) 11/12/2019   TRIG 69.0 11/12/2019   CHOLHDL 5 11/12/2019   Elevated PSA-biopsy in 2000 for PSA of 6.6.  At this time his prostate measured 49 cc.  Repeat biopsy in March 2005, which was negative.  At this time his state measured 92 cc.  The years his PSA continues to increase.  Has not been seen by urology since. Does have a family history of prostate cancer  Lab Results  Component Value Date   PSA 53.75 (H) 11/12/2019   PSA 30.78 (H) 11/07/2018   PSA 18.35 (H) 10/11/2017   Chronic left hip pain - has been seen by orthopedics and advised that he needs a hip replacement. He is not ready to go through with the hip surgery yet   All immunizations and health maintenance protocols were reviewed with the patient and needed orders were placed.  Appropriate screening laboratory values were ordered for the patient including screening of hyperlipidemia, renal function and hepatic function. If indicated by BPH, a PSA was ordered.  Medication reconciliation,  past medical history, social history, problem list and allergies were reviewed in detail with the patient  Goals were established with regard to weight loss, exercise, and  diet in  compliance with medications  Wt Readings from Last 3 Encounters:  11/10/20 191 lb 8 oz (86.9 kg)  09/30/20 195 lb 8 oz (88.7 kg)  06/09/20 192 lb 6.4 oz (87.3 kg)   Due for cologuard   Review of Systems  Constitutional: Negative.   HENT: Negative.   Eyes: Negative.   Respiratory: Negative.   Cardiovascular: Negative.   Gastrointestinal: Negative.   Endocrine: Negative.   Genitourinary: Negative.   Musculoskeletal: Positive for arthralgias and gait problem.  Skin: Negative.   Allergic/Immunologic: Negative.   Hematological: Negative.   Psychiatric/Behavioral: Negative.   All other systems reviewed and are negative.  Past Medical History:  Diagnosis Date  . Blood in stool   . BPH (benign prostatic hyperplasia)   . Chicken pox   . Hypertension     Social History   Socioeconomic History  . Marital status: Married    Spouse name: Not on file  . Number of children: Not on file  . Years of education: Not on file  . Highest education level: Not on file  Occupational History  . Not on file  Tobacco Use  . Smoking status: Never Smoker  . Smokeless tobacco: Never Used  Substance and Sexual Activity  . Alcohol use: No  . Drug use: No  . Sexual activity: Not on file  Other Topics Concern  . Not on file  Social History Narrative   Married   8 kids    6 live locally.  Work in [employer]   He likes to stay active in [employer].    Social Determinants of Health   Financial Resource Strain: Low Risk   . Difficulty of Paying Living Expenses: Not hard at all  Food Insecurity: No Food Insecurity  . Worried About Charity fundraiser in the Last Year: Never true  . Ran Out of Food in the Last Year: Never true  Transportation Needs: No Transportation Needs  . Lack of Transportation (Medical): No  . Lack of Transportation (Non-Medical): No  Physical Activity: Inactive  . Days of Exercise per Week: 0 days  . Minutes of Exercise per Session: 0 min  Stress:  No Stress Concern Present  . Feeling of Stress : Not at all  Social Connections: Socially Integrated  . Frequency of Communication with Friends and Family: Three times a week  . Frequency of Social Gatherings with Friends and Family: More than three times a week  . Attends Religious Services: More than 4 times per year  . Active Member of Clubs or Organizations: Yes  . Attends Archivist Meetings: More than 4 times per year  . Marital Status: Married  Human resources officer Violence: Not At Risk  . Fear of Current or Ex-Partner: No  . Emotionally Abused: No  . Physically Abused: No  . Sexually Abused: No    Past Surgical History:  Procedure Laterality Date  . PROSTATE SURGERY  2007    Family History  Problem Relation Age of Onset  . Hypertension Mother        parent  . Hypertension Maternal Grandfather        grandparent   . Colon cancer Neg Hx   . Colon polyps Neg Hx   . Esophageal cancer Neg Hx   . Rectal cancer Neg Hx   . Stomach cancer Neg Hx     No Known Allergies  Current Outpatient Medications on File Prior to Visit  Medication Sig Dispense Refill  . amLODipine (NORVASC) 5 MG tablet TAKE 1 TABLET(5 MG) BY MOUTH DAILY 90 tablet 3  . lisinopril-hydrochlorothiazide (ZESTORETIC) 20-25 MG tablet TAKE 1 TABLET BY MOUTH DAILY 30 tablet 0  . simvastatin (ZOCOR) 10 MG tablet Take 1 tablet (10 mg total) by mouth daily. 90 tablet 3   No current facility-administered medications on file prior to visit.    BP 124/80   Ht 5\' 4"  (1.626 m)   Wt 191 lb 8 oz (86.9 kg)   BMI 32.87 kg/m       Objective:   Physical Exam Vitals and nursing note reviewed.  Constitutional:      General: He is not in acute distress.    Appearance: Normal appearance. He is well-developed. He is obese.  HENT:     Head: Normocephalic and atraumatic.     Right Ear: Tympanic membrane, ear canal and external ear normal. There is no impacted cerumen.     Left Ear: Tympanic membrane, ear canal  and external ear normal. There is no impacted cerumen.     Nose: Nose normal. No congestion or rhinorrhea.     Mouth/Throat:     Mouth: Mucous membranes are moist.     Pharynx: Oropharynx is clear. No oropharyngeal exudate or posterior oropharyngeal erythema.  Eyes:     General:        Right eye: No discharge.        Left eye: No discharge.     Extraocular Movements: Extraocular movements intact.  Conjunctiva/sclera: Conjunctivae normal.     Pupils: Pupils are equal, round, and reactive to light.  Neck:     Vascular: No carotid bruit.     Trachea: No tracheal deviation.  Cardiovascular:     Rate and Rhythm: Normal rate and regular rhythm.     Pulses: Normal pulses.     Heart sounds: Normal heart sounds. No murmur heard. No friction rub. No gallop.   Pulmonary:     Effort: Pulmonary effort is normal. No respiratory distress.     Breath sounds: Normal breath sounds. No stridor. No wheezing, rhonchi or rales.  Chest:     Chest wall: No tenderness.  Abdominal:     General: Bowel sounds are normal. There is no distension.     Palpations: Abdomen is soft. There is no mass.     Tenderness: There is no abdominal tenderness. There is no right CVA tenderness, left CVA tenderness, guarding or rebound.     Hernia: No hernia is present.  Musculoskeletal:        General: No swelling, tenderness, deformity or signs of injury. Normal range of motion.     Right lower leg: No edema.     Left lower leg: No edema.  Lymphadenopathy:     Cervical: No cervical adenopathy.  Skin:    General: Skin is warm and dry.     Capillary Refill: Capillary refill takes less than 2 seconds.     Coloration: Skin is not jaundiced or pale.     Findings: No bruising, erythema, lesion or rash.  Neurological:     General: No focal deficit present.     Mental Status: He is alert and oriented to person, place, and time.     Cranial Nerves: No cranial nerve deficit.     Sensory: No sensory deficit.     Motor: No  weakness.     Coordination: Coordination normal.     Gait: Gait abnormal (limping gait).     Deep Tendon Reflexes: Reflexes normal.  Psychiatric:        Mood and Affect: Mood normal.        Behavior: Behavior normal.        Thought Content: Thought content normal.        Judgment: Judgment normal.       Assessment & Plan:  1. Routine general medical examination at a health care facility - Follow up in one year or sooner if needed - encouraged weight loss through lifestyle modifications  - CBC with Differential/Platelet; Future - Comprehensive metabolic panel; Future - Lipid panel; Future - TSH; Future  2. Essential hypertension - Well controlled. No change in medications  - CBC with Differential/Platelet; Future - Comprehensive metabolic panel; Future - Lipid panel; Future - TSH; Future  3. Mixed hyperlipidemia - Consider increase in statin  - CBC with Differential/Platelet; Future - Comprehensive metabolic panel; Future - Lipid panel; Future - TSH; Future  4. Elevated PSA - Encouraged follow up with urology  - PSA; Future  5. Need for hepatitis C screening test  - Hep C Antibody; Future  6. Arthritis of hip - Follow up with orthopedics when ready   7. Colon cancer screening - will order cologuard  Dorothyann Peng, NP

## 2020-11-10 NOTE — Patient Instructions (Signed)
It was great seeing you today   We will order a cologuard for you   We will follow up with you reagrding your blood work   Please follow up with Urology  Phone: [phone]  I will see you back in one year or sooner if needed

## 2020-11-11 LAB — HEPATITIS C ANTIBODY
Hepatitis C Ab: NONREACTIVE
SIGNAL TO CUT-OFF: 0.01 (ref <1.00)

## 2020-11-24 DIAGNOSIS — Z1211 Encounter for screening for malignant neoplasm of colon: Secondary | ICD-10-CM | POA: Diagnosis not present

## 2020-11-24 DIAGNOSIS — Z1212 Encounter for screening for malignant neoplasm of rectum: Secondary | ICD-10-CM | POA: Diagnosis not present

## 2020-11-24 LAB — COLOGUARD: Cologuard: NEGATIVE

## 2020-12-02 LAB — EXTERNAL GENERIC LAB PROCEDURE: COLOGUARD: NEGATIVE

## 2020-12-04 ENCOUNTER — Encounter: Payer: Self-pay | Admitting: Adult Health

## 2021-04-13 ENCOUNTER — Other Ambulatory Visit: Payer: Self-pay | Admitting: Adult Health

## 2021-04-13 DIAGNOSIS — I10 Essential (primary) hypertension: Secondary | ICD-10-CM

## 2021-05-25 ENCOUNTER — Other Ambulatory Visit: Payer: Self-pay

## 2021-05-26 ENCOUNTER — Encounter: Payer: Self-pay | Admitting: Adult Health

## 2021-05-26 ENCOUNTER — Ambulatory Visit (INDEPENDENT_AMBULATORY_CARE_PROVIDER_SITE_OTHER): Payer: Medicare Other | Admitting: Adult Health

## 2021-05-26 VITALS — BP 140/80 | HR 73 | Temp 98.5°F | Ht 64.0 in | Wt 183.0 lb

## 2021-05-26 DIAGNOSIS — M1610 Unilateral primary osteoarthritis, unspecified hip: Secondary | ICD-10-CM

## 2021-05-26 DIAGNOSIS — Z23 Encounter for immunization: Secondary | ICD-10-CM

## 2021-05-26 NOTE — Progress Notes (Signed)
Subjective:    Patient ID: John Stone, male    DOB: 03/25/1951, 70 y.o.   MRN: [id number]  HPI  70 year old male who  has a past medical history of Blood in stool, BPH (benign prostatic hyperplasia), Chicken pox, and Hypertension.  He presents to the office today for referral to orthopedics for chronic right hip pain . He has been evaluated in the past by orthopedics and was advised he needed a hip replacement. He did not want to go through with the surgery at that time. He is now ready for surgery.   Review of Systems See HPI   Past Medical History:  Diagnosis Date   Blood in stool    BPH (benign prostatic hyperplasia)    Chicken pox    Hypertension     Social History   Socioeconomic History   Marital status: Married    Spouse name: Not on file   Number of children: Not on file   Years of education: Not on file   Highest education level: Not on file  Occupational History   Not on file  Tobacco Use   Smoking status: Never   Smokeless tobacco: Never  Substance and Sexual Activity   Alcohol use: No   Drug use: No   Sexual activity: Not on file  Other Topics Concern   Not on file  Social History Narrative   Married   8 kids    6 live locally.       Work in [employer]   He likes to stay active in [employer].    Social Determinants of Health   Financial Resource Strain: Low Risk    Difficulty of Paying Living Expenses: Not hard at all  Food Insecurity: No Food Insecurity   Worried About Charity fundraiser in the Last Year: Never true   [place] in the Last Year: Never true  Transportation Needs: No Transportation Needs   Lack of Transportation (Medical): No   Lack of Transportation (Non-Medical): No  Physical Activity: Inactive   Days of Exercise per Week: 0 days   Minutes of Exercise per Session: 0 min  Stress: No Stress Concern Present   Feeling of Stress : Not at all  Social Connections: Socially Integrated   Frequency of  Communication with Friends and Family: Three times a week   Frequency of Social Gatherings with Friends and Family: More than three times a week   Attends Religious Services: More than 4 times per year   Active Member of [employer] or Organizations: Yes   Attends Music therapist: More than 4 times per year   Marital Status: Married  Human resources officer Violence: Not At Risk   Fear of Current or Ex-Partner: No   Emotionally Abused: No   Physically Abused: No   Sexually Abused: No    Past Surgical History:  Procedure Laterality Date   PROSTATE SURGERY  2007    Family History  Problem Relation Age of Onset   Hypertension Mother        parent   Hypertension Maternal Grandfather        grandparent    Colon cancer Neg Hx    Colon polyps Neg Hx    Esophageal cancer Neg Hx    Rectal cancer Neg Hx    Stomach cancer Neg Hx     No Known Allergies  Current Outpatient Medications on File Prior to Visit  Medication Sig Dispense Refill  amLODipine (NORVASC) 5 MG tablet TAKE 1 TABLET(5 MG) BY MOUTH DAILY 90 tablet 3   lisinopril-hydrochlorothiazide (ZESTORETIC) 20-25 MG tablet Take 1 tablet by mouth daily. 90 tablet 3   simvastatin (ZOCOR) 20 MG tablet Take 0.5 tablets (10 mg total) by mouth daily. 90 tablet 3   No current facility-administered medications on file prior to visit.    BP 140/80   Pulse 73   Temp 98.5 F (36.9 C) (Oral)   Ht 5\' 4"  (1.626 m)   Wt 183 lb (83 kg)   SpO2 100%   BMI 31.41 kg/m       Objective:   Physical Exam Vitals and nursing note reviewed.  Constitutional:      Appearance: Normal appearance.  Cardiovascular:     Rate and Rhythm: Normal rate and regular rhythm.     Pulses: Normal pulses.     Heart sounds: Normal heart sounds.  Pulmonary:     Effort: Pulmonary effort is normal.     Breath sounds: Normal breath sounds.  Musculoskeletal:        General: Normal range of motion.  Skin:    General: Skin is warm and dry.  Neurological:      General: No focal deficit present.     Mental Status: He is alert and oriented to person, place, and time.  Psychiatric:        Mood and Affect: Mood normal.        Behavior: Behavior normal.        Thought Content: Thought content normal.        Judgment: Judgment normal.      Assessment & Plan:  1. Arthritis of hip  - Ambulatory referral to Orthopedic Surgery  2. Need for immunization against influenza  - Flu Vaccine QUAD High Dose(Fluad)

## 2021-05-26 NOTE — Patient Instructions (Signed)
I have referred you to see Dr. Maureen Ralphs at [facility] for evaluation of hip surgery - they will call you to schedule appointment   Your Urologist is Dr. Kathie Rhodes at [facility]  Phone: [phone]

## 2021-06-21 DIAGNOSIS — M1611 Unilateral primary osteoarthritis, right hip: Secondary | ICD-10-CM | POA: Diagnosis not present

## 2021-06-30 ENCOUNTER — Encounter: Payer: Self-pay | Admitting: Adult Health

## 2021-06-30 ENCOUNTER — Ambulatory Visit (INDEPENDENT_AMBULATORY_CARE_PROVIDER_SITE_OTHER): Payer: Medicare Other | Admitting: Adult Health

## 2021-06-30 VITALS — BP 120/70 | HR 55 | Temp 98.6°F | Ht 64.0 in | Wt 183.0 lb

## 2021-06-30 DIAGNOSIS — Z01818 Encounter for other preprocedural examination: Secondary | ICD-10-CM | POA: Diagnosis not present

## 2021-06-30 LAB — COMPREHENSIVE METABOLIC PANEL
ALT: 19 U/L (ref 0–53)
AST: 14 U/L (ref 0–37)
Albumin: 4.3 g/dL (ref 3.5–5.2)
Alkaline Phosphatase: 56 U/L (ref 39–117)
BUN: 22 mg/dL (ref 6–23)
CO2: 29 mEq/L (ref 19–32)
Calcium: 9.4 mg/dL (ref 8.4–10.5)
Chloride: 102 mEq/L (ref 96–112)
Creatinine, Ser: 1.08 mg/dL (ref 0.40–1.50)
GFR: 69.14 mL/min (ref >60.00)
Glucose, Bld: 85 mg/dL (ref 70–99)
Potassium: 4.2 mEq/L (ref 3.5–5.1)
Sodium: 138 mEq/L (ref 135–145)
Total Bilirubin: 0.6 mg/dL (ref 0.2–1.2)
Total Protein: 7.2 g/dL (ref 6.0–8.3)

## 2021-06-30 LAB — URINALYSIS
Bilirubin Urine: NEGATIVE
Hgb urine dipstick: NEGATIVE
Ketones, ur: NEGATIVE
Leukocytes,Ua: NEGATIVE
Nitrite: NEGATIVE
Specific Gravity, Urine: 1.025 (ref 1.000–1.030)
Total Protein, Urine: NEGATIVE
Urine Glucose: 100 — AB
Urobilinogen, UA: 0.2 (ref 0.0–1.0)
pH: 6 (ref 5.0–8.0)

## 2021-06-30 LAB — CBC WITH DIFFERENTIAL/PLATELET
Basophils Absolute: 0 10*3/uL (ref 0.0–0.1)
Basophils Relative: 0.6 % (ref 0.0–3.0)
Eosinophils Absolute: 0.1 10*3/uL (ref 0.0–0.7)
Eosinophils Relative: 2.5 % (ref 0.0–5.0)
HCT: 44.7 % (ref 39.0–52.0)
Hemoglobin: 14.7 g/dL (ref 13.0–17.0)
Lymphocytes Relative: 25.5 % (ref 12.0–46.0)
Lymphs Abs: 1.5 10*3/uL (ref 0.7–4.0)
MCHC: 32.8 g/dL (ref 30.0–36.0)
MCV: 85.9 fl (ref 78.0–100.0)
Monocytes Absolute: 0.6 10*3/uL (ref 0.1–1.0)
Monocytes Relative: 10 % (ref 3.0–12.0)
Neutro Abs: 3.6 10*3/uL (ref 1.4–7.7)
Neutrophils Relative %: 61.4 % (ref 43.0–77.0)
Platelets: 201 10*3/uL (ref 150.0–400.0)
RBC: 5.2 Mil/uL (ref 4.22–5.81)
RDW: 13.8 % (ref 11.5–15.5)
WBC: 5.8 10*3/uL (ref 4.0–10.5)

## 2021-06-30 LAB — HEMOGLOBIN A1C: Hgb A1c MFr Bld: 6.1 % (ref 4.6–6.5)

## 2021-06-30 LAB — PROTIME-INR
INR: 1 ratio (ref 0.8–1.0)
Prothrombin Time: 11.4 s (ref 9.6–13.1)

## 2021-06-30 NOTE — Progress Notes (Signed)
Subjective:    Patient ID: John Stone, male    DOB: Apr 30, 1951, 70 y.o.   MRN: [id number]  HPI  70 year old male who  has a past medical history of Blood in stool, BPH (benign prostatic hyperplasia), Chicken pox, and Hypertension.  He presents to the office today for pre operative clearance. He will be having a right total hip done by Dr.Swintek. He will have spinal anesthesia done.   He does not have any questions about the surgery   Review of Systems See HPI Past Medical History:  Diagnosis Date   Blood in stool    BPH (benign prostatic hyperplasia)    Chicken pox    Hypertension     Social History   Socioeconomic History   Marital status: Married    Spouse name: Not on file   Number of children: Not on file   Years of education: Not on file   Highest education level: Not on file  Occupational History   Not on file  Tobacco Use   Smoking status: Never   Smokeless tobacco: Never  Substance and Sexual Activity   Alcohol use: No   Drug use: No   Sexual activity: Not on file  Other Topics Concern   Not on file  Social History Narrative   Married   8 kids    6 live locally.       Work in [employer]   He likes to stay active in [employer].    Social Determinants of Health   Financial Resource Strain: Low Risk    Difficulty of Paying Living Expenses: Not hard at all  Food Insecurity: No Food Insecurity   Worried About Charity fundraiser in the Last Year: Never true   [place] in the Last Year: Never true  Transportation Needs: No Transportation Needs   Lack of Transportation (Medical): No   Lack of Transportation (Non-Medical): No  Physical Activity: Inactive   Days of Exercise per Week: 0 days   Minutes of Exercise per Session: 0 min  Stress: No Stress Concern Present   Feeling of Stress : Not at all  Social Connections: Socially Integrated   Frequency of Communication with Friends and Family: Three times a week   Frequency of  Social Gatherings with Friends and Family: More than three times a week   Attends Religious Services: More than 4 times per year   Active Member of [employer] or Organizations: Yes   Attends Music therapist: More than 4 times per year   Marital Status: Married  Human resources officer Violence: Not At Risk   Fear of Current or Ex-Partner: No   Emotionally Abused: No   Physically Abused: No   Sexually Abused: No    Past Surgical History:  Procedure Laterality Date   PROSTATE SURGERY  2007    Family History  Problem Relation Age of Onset   Hypertension Mother        parent   Hypertension Maternal Grandfather        grandparent    Colon cancer Neg Hx    Colon polyps Neg Hx    Esophageal cancer Neg Hx    Rectal cancer Neg Hx    Stomach cancer Neg Hx     No Known Allergies  Current Outpatient Medications on File Prior to Visit  Medication Sig Dispense Refill   amLODipine (NORVASC) 5 MG tablet TAKE 1 TABLET(5 MG) BY MOUTH DAILY 90 tablet 3  lisinopril-hydrochlorothiazide (ZESTORETIC) 20-25 MG tablet Take 1 tablet by mouth daily. 90 tablet 3   simvastatin (ZOCOR) 20 MG tablet Take 0.5 tablets (10 mg total) by mouth daily. (Patient not taking: Reported on 06/30/2021) 90 tablet 3   No current facility-administered medications on file prior to visit.    BP 120/70   Pulse (!) 55   Temp 98.6 F (37 C) (Oral)   Ht 5\' 4"  (1.626 m)   Wt 183 lb (83 kg)   SpO2 100%   BMI 31.41 kg/m      Objective:   Physical Exam Vitals and nursing note reviewed.  Constitutional:      General: He is not in acute distress.    Appearance: Normal appearance. He is well-developed and normal weight.  HENT:     Head: Normocephalic and atraumatic.     Nose: No congestion or rhinorrhea.  Neck:     Vascular: No carotid bruit.     Trachea: No tracheal deviation.  Cardiovascular:     Rate and Rhythm: Normal rate and regular rhythm.     Pulses: Normal pulses.     Heart sounds: Normal heart  sounds. No murmur heard.   No friction rub. No gallop.  Pulmonary:     Effort: Pulmonary effort is normal. No respiratory distress.     Breath sounds: Normal breath sounds. No stridor. No wheezing, rhonchi or rales.  Chest:     Chest wall: No tenderness.  Abdominal:     General: Bowel sounds are normal. There is no distension.     Palpations: Abdomen is soft. There is no mass.     Tenderness: There is no abdominal tenderness. There is no right CVA tenderness, left CVA tenderness, guarding or rebound.     Hernia: No hernia is present.  Musculoskeletal:        General: No swelling, tenderness, deformity or signs of injury. Normal range of motion.     Right lower leg: No edema.     Left lower leg: No edema.  Lymphadenopathy:     Cervical: No cervical adenopathy.  Skin:    General: Skin is warm and dry.     Capillary Refill: Capillary refill takes less than 2 seconds.     Coloration: Skin is not jaundiced or pale.     Findings: No bruising, erythema, lesion or rash.  Neurological:     General: No focal deficit present.     Mental Status: He is alert and oriented to person, place, and time.     Cranial Nerves: No cranial nerve deficit.     Sensory: No sensory deficit.     Motor: No weakness.     Coordination: Coordination normal.     Gait: Gait normal.     Deep Tendon Reflexes: Reflexes normal.  Psychiatric:        Mood and Affect: Mood normal.        Behavior: Behavior normal.        Thought Content: Thought content normal.        Judgment: Judgment normal.       Assessment & Plan:  1. Preoperative clearance  - CBC with Differential/Platelet; Future - Comprehensive metabolic panel; Future - Hemoglobin A1c; Future - Urinalysis; Future - DG Chest 2 View; Future - EKG 12-Lead - Protime-INR; Future  Dorothyann Peng, NP

## 2021-08-15 DIAGNOSIS — C61 Malignant neoplasm of prostate: Secondary | ICD-10-CM

## 2021-08-15 DIAGNOSIS — Z191 Hormone sensitive malignancy status: Secondary | ICD-10-CM

## 2021-08-15 HISTORY — DX: Hormone sensitive malignancy status: Z19.1

## 2021-08-15 HISTORY — DX: Malignant neoplasm of prostate: C61

## 2021-09-08 ENCOUNTER — Other Ambulatory Visit (HOSPITAL_COMMUNITY): Payer: Self-pay | Admitting: Urology

## 2021-09-08 DIAGNOSIS — C61 Malignant neoplasm of prostate: Secondary | ICD-10-CM

## 2021-09-17 ENCOUNTER — Encounter (HOSPITAL_COMMUNITY): Payer: Self-pay

## 2021-09-17 ENCOUNTER — Ambulatory Visit (HOSPITAL_COMMUNITY)
Admission: RE | Admit: 2021-09-17 | Discharge: 2021-09-17 | Disposition: A | Discharge status: Home / Self Care | Payer: Medicare Other | Source: Ambulatory Visit | Attending: Urology | Admitting: Urology

## 2021-09-17 ENCOUNTER — Other Ambulatory Visit: Payer: Self-pay

## 2021-09-17 DIAGNOSIS — C61 Malignant neoplasm of prostate: Secondary | ICD-10-CM | POA: Insufficient documentation

## 2021-09-17 MED ORDER — PIFLIFOLASTAT F 18 (PYLARIFY) INJECTION
9.0000 | Freq: Once | INTRAVENOUS | Status: AC
  Administered 2021-09-17: 8.5 via INTRAVENOUS

## 2021-09-23 ENCOUNTER — Other Ambulatory Visit: Payer: Self-pay

## 2021-09-23 ENCOUNTER — Ambulatory Visit (HOSPITAL_COMMUNITY)
Admission: RE | Admit: 2021-09-23 | Discharge: 2021-09-23 | Disposition: A | Discharge status: Home / Self Care | Payer: Medicare Other | Source: Ambulatory Visit | Attending: Urology | Admitting: Urology

## 2021-09-23 ENCOUNTER — Ambulatory Visit (HOSPITAL_COMMUNITY): Admission: RE | Admit: 2021-09-23 | Payer: Medicare Other | Source: Ambulatory Visit

## 2021-09-23 DIAGNOSIS — I2510 Atherosclerotic heart disease of native coronary artery without angina pectoris: Secondary | ICD-10-CM | POA: Diagnosis not present

## 2021-09-23 DIAGNOSIS — C775 Secondary and unspecified malignant neoplasm of intrapelvic lymph nodes: Secondary | ICD-10-CM | POA: Diagnosis not present

## 2021-09-23 DIAGNOSIS — I700 Atherosclerosis of aorta: Secondary | ICD-10-CM | POA: Diagnosis not present

## 2021-09-23 DIAGNOSIS — K4020 Bilateral inguinal hernia, without obstruction or gangrene, not specified as recurrent: Secondary | ICD-10-CM | POA: Diagnosis not present

## 2021-09-23 DIAGNOSIS — M1611 Unilateral primary osteoarthritis, right hip: Secondary | ICD-10-CM | POA: Diagnosis not present

## 2021-09-23 DIAGNOSIS — C61 Malignant neoplasm of prostate: Secondary | ICD-10-CM | POA: Insufficient documentation

## 2021-09-23 MED ORDER — PIFLIFOLASTAT F 18 (PYLARIFY) INJECTION
9.0000 | Freq: Once | INTRAVENOUS | Status: AC
  Administered 2021-09-23: 9.56 via INTRAVENOUS

## 2021-10-06 ENCOUNTER — Ambulatory Visit: Payer: Medicare Other

## 2021-10-08 DIAGNOSIS — C775 Secondary and unspecified malignant neoplasm of intrapelvic lymph nodes: Secondary | ICD-10-CM | POA: Diagnosis not present

## 2021-10-08 DIAGNOSIS — R3914 Feeling of incomplete bladder emptying: Secondary | ICD-10-CM | POA: Diagnosis not present

## 2021-10-14 NOTE — Progress Notes (Signed)
I called pt to introduce myself as the Prostate Nurse Navigator and the Coordinator of the Prostate [place]. ?  ?1. I confirmed with the patient/patient's wife he is aware of his referral to the clinic on 3/7, arriving @ 12:30 pm.  ?  ?2. I discussed the format of the clinic and the physicians he will be seeing that day. ?  ?3. I discussed where the clinic is located and how to contact me. ?  ?4. I confirmed his address and informed him I would be mailing a packet of information and forms to be completed. I asked him to bring them with him the day of his appointment.  ?  ?He voiced understanding of the above. I asked him to call me if he has any questions or concerns regarding his appointments or the forms he needs to complete.  ?

## 2021-10-18 NOTE — Progress Notes (Addendum)
? ?                              Care Plan Summary ? ?Name: John Stone ?DOB: [birth year]  ? ?Your Medical Team:  ? Urologist -  Dr. Raynelle Bring, [facility] ? Radiation Oncologist - Dr. Tyler Pita, [place]  ? Medical Oncologist - Dr. Zola Button, [place] ? ?Recommendations: ?1) Hormonal Therapy   ?2) Radiation   ? ? ?* These recommendations are based on information available as of today?s consult.      Recommendations may change depending on the results of further tests or exams. ? ? ? ?Next Steps: ?1) Continue with hormonal therapy by Urology  ?2) [facility] will contact you to set up radiation treatment.  ? ? ?When appointments need to be scheduled, you will be contacted by [facility] and/or [facility]. ? ?Questions?  Please do not hesitate to call Katheren Puller, BSN, RN at [phone] with any questions or concerns.  [name] is your Oncology Nurse Navigator and is available to assist you while you?re receiving your medical care at [facility].   ?

## 2021-10-18 NOTE — Progress Notes (Signed)
Spoke with patient's wife for reminder of [place] appointment on 3/7 @ 12:30pm.  ?

## 2021-10-19 ENCOUNTER — Inpatient Hospital Stay: Payer: Medicare Other | Attending: Oncology | Admitting: Oncology

## 2021-10-19 ENCOUNTER — Ambulatory Visit
Admission: RE | Admit: 2021-10-19 | Discharge: 2021-10-19 | Disposition: A | Discharge status: Home / Self Care | Payer: Medicare Other | Source: Ambulatory Visit | Attending: Radiation Oncology | Admitting: Radiation Oncology

## 2021-10-19 ENCOUNTER — Ambulatory Visit (HOSPITAL_BASED_OUTPATIENT_CLINIC_OR_DEPARTMENT_OTHER): Payer: Medicare Other | Admitting: Genetic Counselor

## 2021-10-19 ENCOUNTER — Other Ambulatory Visit: Payer: Self-pay

## 2021-10-19 VITALS — BP 151/77 | HR 62 | Temp 96.1°F | Resp 18 | Ht 64.0 in | Wt 185.2 lb

## 2021-10-19 DIAGNOSIS — N400 Enlarged prostate without lower urinary tract symptoms: Secondary | ICD-10-CM | POA: Diagnosis not present

## 2021-10-19 DIAGNOSIS — I2510 Atherosclerotic heart disease of native coronary artery without angina pectoris: Secondary | ICD-10-CM | POA: Diagnosis not present

## 2021-10-19 DIAGNOSIS — Z79899 Other long term (current) drug therapy: Secondary | ICD-10-CM | POA: Insufficient documentation

## 2021-10-19 DIAGNOSIS — I700 Atherosclerosis of aorta: Secondary | ICD-10-CM | POA: Diagnosis not present

## 2021-10-19 DIAGNOSIS — C61 Malignant neoplasm of prostate: Secondary | ICD-10-CM | POA: Diagnosis not present

## 2021-10-19 DIAGNOSIS — C775 Secondary and unspecified malignant neoplasm of intrapelvic lymph nodes: Secondary | ICD-10-CM | POA: Diagnosis not present

## 2021-10-19 DIAGNOSIS — N281 Cyst of kidney, acquired: Secondary | ICD-10-CM | POA: Diagnosis not present

## 2021-10-19 DIAGNOSIS — Z8249 Family history of ischemic heart disease and other diseases of the circulatory system: Secondary | ICD-10-CM | POA: Insufficient documentation

## 2021-10-19 DIAGNOSIS — K4020 Bilateral inguinal hernia, without obstruction or gangrene, not specified as recurrent: Secondary | ICD-10-CM | POA: Insufficient documentation

## 2021-10-19 DIAGNOSIS — I10 Essential (primary) hypertension: Secondary | ICD-10-CM | POA: Insufficient documentation

## 2021-10-19 NOTE — Progress Notes (Signed)
Reason for the request:    Prostate cancer  HPI: I was asked by Dr. Milford Cage to evaluate John Stone for the evaluation of prostate cancer.  He is a 71 year old man with history of elevated PSA dating back to October 2020.  At that time he had BPH only.  He had a repeat biopsy 5 which also did not show any evidence of disease.  He has subsequently reevaluated in January 2023 after PSA continues to rise up to 60.69 in December 2022.  He underwent a prostate biopsy completed on August 27, 2021 showed a Gleason score 4+4 = 8 and 70% of the cores as well as Gleason score 7.  PET scan obtained at that time showed locally advanced disease with pelvic nodal metastasis including right iliac, left periaortic among other other pelvic lymph nodes.  He was started on Firmagon and currently under evaluation for additional treatment.  Clinically, he reports symptoms of frequency, urgency as well as nocturia that has worsened in the last few years.  Otherwise he remains fairly active in good health.  He does have history of hypertension takes amlodipine but otherwise no significant comorbid patients.  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   He does not report any headaches, blurry vision, syncope or seizures. Does not report any fevers, chills or sweats.  Does not report any cough, wheezing or hemoptysis.  Does not report any chest pain, palpitation, orthopnea or leg edema.  Does not report any nausea, vomiting or abdominal pain.  Does not report any constipation or diarrhea.  Does not report any skeletal complaints.    Does not report frequency, urgency or hematuria.  Does not report any skin rashes or lesions. Does not report any heat or cold intolerance.  Does not report any lymphadenopathy or petechiae.  Does not report any anxiety or depression.  Remaining review of systems is negative.     Past Medical History:  Diagnosis Date   Blood in stool    BPH (benign prostatic hyperplasia)     Chicken pox    Hypertension   :   Past Surgical History:  Procedure Laterality Date   PROSTATE SURGERY  2007  :   Current Outpatient Medications:    amLODipine (NORVASC) 5 MG tablet, TAKE 1 TABLET(5 MG) BY MOUTH DAILY, Disp: 90 tablet, Rfl: 3   lisinopril-hydrochlorothiazide (ZESTORETIC) 20-25 MG tablet, Take 1 tablet by mouth daily., Disp: 90 tablet, Rfl: 3   simvastatin (ZOCOR) 20 MG tablet, Take 0.5 tablets (10 mg total) by mouth daily. (Patient not taking: Reported on 06/30/2021), Disp: 90 tablet, Rfl: 3:  No Known Allergies:   Family History  Problem Relation Age of Onset   Hypertension Mother        parent   Hypertension Maternal Grandfather        grandparent    Colon cancer Neg Hx    Colon polyps Neg Hx    Esophageal cancer Neg Hx    Rectal cancer Neg Hx    Stomach cancer Neg Hx   :   Social History   Socioeconomic History   Marital status: Married    Spouse name: Not on file   Number of children: Not on file   Years of education: Not on file   Highest education level: Not on file  Occupational History   Not on file  Tobacco Use   Smoking status: Never   Smokeless tobacco: Never  Substance and Sexual Activity   Alcohol  use: No   Drug use: No   Sexual activity: Not on file  Other Topics Concern   Not on file  Social History Narrative   Married   8 kids    6 live locally.       Work in [employer]   He likes to stay active in [employer].    Social Determinants of Health   Financial Resource Strain: Not on file  Food Insecurity: Not on file  Transportation Needs: Not on file  Physical Activity: Not on file  Stress: Not on file  Social Connections: Not on file  Intimate Partner Violence: Not on file  :  Pertinent items are noted in HPI.  Exam:  General appearance: alert and cooperative appeared without distress. Head: atraumatic without any abnormalities. Eyes: conjunctivae/corneas clear. PERRL.  Sclera anicteric. Throat: lips,  mucosa, and tongue normal; without oral thrush or ulcers. Resp: clear to auscultation bilaterally without rhonchi, wheezes or dullness to percussion. Cardio: regular rate and rhythm, S1, S2 normal, no murmur, click, rub or gallop GI: soft, non-tender; bowel sounds normal; no masses,  no organomegaly Skin: Skin color, texture, turgor normal. No rashes or lesions Lymph nodes: Cervical, supraclavicular, and axillary nodes normal. Neurologic: Grossly normal without any motor, sensory or deep tendon reflexes. Musculoskeletal: No joint deformity or effusion.    NM PET (PSMA) SKULL TO MID THIGH  Result Date: 09/24/2021 CLINICAL DATA:  High-grade prostate cancer. Recent biopsy. PSA of 60. EXAM: NUCLEAR MEDICINE PET SKULL BASE TO THIGH TECHNIQUE: 9.6 mCi F18 Piflufolastat (Pylarify) was injected intravenously. Full-ring PET imaging was performed from the skull base to thigh after the radiotracer. CT data was obtained and used for attenuation correction and anatomic localization. COMPARISON:  Abdominopelvic CT 02/22/2004. FINDINGS: NECK No radiotracer activity in neck lymph nodes. Incidental CT finding: No cervical adenopathy. CHEST No radiotracer accumulation within mediastinal or hilar lymph nodes. No suspicious pulmonary nodules on the CT scan. Incidental CT finding: Aortic and coronary artery calcification. Mild cardiomegaly. ABDOMEN/PELVIS Prostate: Locally advanced prostate primary, with extracapsular extension on 191/4. Measures a S.U.V. max of 70.0. Lymph nodes: Small nodes within the superior aspect of the sigmoid mesocolon including at up to 7 mm and a S.U.V. max of 3.5 on 155/4. Right pelvic sidewall nodes, including at 4 mm and a S.U.V. max of 1.8 on 178/4. More centrally at 1.0 cm and a S.U.V. max of 8.8 on 180/4. Liver: No evidence of liver metastasis Incidental CT finding: Normal adrenal glands. Inter/lower pole subcentimeter right renal cyst. No renal calculi. Normal noncontrast appearance of the  liver, gallbladder, pancreas, stomach. Scattered colonic diverticula. Small fat containing bilateral inguinal hernias. SKELETON No focal activity to suggest skeletal metastasis. Degenerative changes of the right hip. Probable bone island in the left iliac. IMPRESSION: 1. Locally advanced, markedly tracer avid primary with pelvic nodal metastasis. 2. No tracer avid extrapelvic metastatic disease. 3. Incidental findings, including: Coronary artery atherosclerosis. Aortic Atherosclerosis (ICD10-I70.0). Electronically Signed   By: Abigail Miyamoto M.D.   On: 09/24/2021 16:59    Assessment and Plan:   71 year old with:  1.  Prostate cancer diagnosed in January 2023 with a PSA of 60, Gleason score of 4+4 = 8 and castration-sensitive advanced disease with pelvic adenopathy including left para-aortic lymph node.  His case was discussed today in the prostate cancer multidisciplinary clinic treatment protocol.  He does have metastatic disease but limited to regional lymph nodes as well as periaortic lymph node.  Definitive therapy with radiation and androgen  deprivation therapy is recommended given his limited metastatic disease.  The role for therapy escalation was discussed today in detail.  Using androgen receptor pathway inhibitors versus systemic chemotherapy were discussed and debated today.  He would be a reasonable candidate for enzalutamide or abiraterone for therapy escalation.  These were discussed with him today and he is open to the idea.  I will arrange a follow-up in the next 3 to 4 weeks to discuss this further and get it started after he has commenced radiation.  2.  Local therapy: He is under consideration to start radiation therapy to the prostate as well as involved lymph nodes.  3.  Androgen deprivation therapy: I recommended continuing this therapy at least for 2 years possibly indefinitely.  Complication including weight gain, hot flashes and sexual dysfunction.  He will receive Eligard  starting next month under the care of Dr. Milford Cage.  4.  Follow-up: We will be in the next few weeks for repeat assessment and consideration of adding enzalutamide or abiraterone.  45  minutes were dedicated to this visit. The time was spent on reviewing laboratory data, imaging studies, discussing treatment options, and answering questions regarding future plan.      A copy of this consult has been forwarded to the requesting physician.

## 2021-10-19 NOTE — Progress Notes (Signed)
Radiation Oncology         [phone]) [phone] ________________________________  Multidisciplinary Prostate Cancer Clinic  Initial Radiation Oncology Consultation  Name: John Stone MRN: [id number]  Date: 10/19/2021  DOB: 1950/10/28  WE:[id number], Tommi Rumps, NP  Remi Haggard, MD   REFERRING PHYSICIAN: Remi Haggard, MD  DIAGNOSIS: 71 y.o. gentleman with locally advanced adenocarcinoma of the prostate involving pelvic and periaortic lymph nodes, with a Gleason's score of 4+4 and a PSA of 60.7.    ICD-10-CM   1. Prostate cancer [facility])  C61       HISTORY OF PRESENT ILLNESS::John Stone is a 71 y.o. gentleman. He has a longstanding history of BPH with  BOO and elevated PSA since at least 2000.  He had a negative prostate biopsy in October 2000, when his PSA was 6.6. He had a repeat biopsy in 10/2007 that was also negative.  He subsequently underwent a TUNA procedure in 11/2007 followed by a simple prostatectomy in 04/2004 under the care of Dr. Gaynelle Arabian. Final surgical pathology was benign with chronic inflammation. He was seen by Dr. Karsten Ro in 2019, at which time his PSA had increased significantly up to 18.35.  Repeat biopsy was recommended, but he declined and then was lost to follow up with [facility] thereafter. He did continue to monitor his PSA with his PCP, Beaulah Dinning, NP.  His PSA has continued to steadily rise over the last several years and reached 60.69 in 07/2021 at which time his PCP referred him back to [facility] urology for further evaluation.  He met with Dr. Milford Cage on 07/30/21, digital rectal examination was performed at that time revealing a hard right lobe.  The patient proceeded to transrectal ultrasound with 12 biopsies of the prostate on 08/27/21.  The prostate volume measured 83.85 cc.  Out of 12 core biopsies,4 were positive.  The maximum Gleason score was 4+4, and this was seen in the right base lateral. Additionally, Gleason 4+3 was seen in the right  mid lateral, right mid, and right base.  A PSMA PET scan was performed on 09/23/21 showing locally advanced disease involving the prostate, with extracapsular extension as well as pelvic and periaortic nodal metastases but no tracer-avid extrapelvic metastatic disease.  The patient reviewed the biopsy and imaging results with his urologist and he has kindly been referred today to the multidisciplinary prostate cancer clinic for presentation of pathology and radiology studies in our conference for discussion of potential radiation treatment options and clinical evaluation.  PREVIOUS RADIATION THERAPY: No  PAST MEDICAL HISTORY:  has a past medical history of Blood in stool, BPH (benign prostatic hyperplasia), Chicken pox, and Hypertension.    PAST SURGICAL HISTORY: Past Surgical History:  Procedure Laterality Date   PROSTATE SURGERY  2007    FAMILY HISTORY: family history includes Hypertension in his maternal grandfather and mother.  SOCIAL HISTORY:  reports that he has never smoked. He has never used smokeless tobacco. He reports that he does not drink alcohol and does not use drugs.  ALLERGIES: Patient has no known allergies.  MEDICATIONS:  Current Outpatient Medications  Medication Sig Dispense Refill   amLODipine (NORVASC) 5 MG tablet TAKE 1 TABLET(5 MG) BY MOUTH DAILY 90 tablet 3   lisinopril-hydrochlorothiazide (ZESTORETIC) 20-25 MG tablet Take 1 tablet by mouth daily. 90 tablet 3   simvastatin (ZOCOR) 20 MG tablet Take 0.5 tablets (10 mg total) by mouth daily. 90 tablet 3   No current facility-administered medications for this encounter.  REVIEW OF SYSTEMS:  On review of systems, the patient reports that he is doing well overall. He denies any chest pain, shortness of breath, cough, fevers, chills, night sweats, unintended weight changes. He denies any bowel disturbances, and denies abdominal pain, nausea or vomiting. He denies any new musculoskeletal or joint aches or pains. His  IPSS was 26, indicating moderate-severe urinary symptoms. His SHIM was 11, indicating he has moderate-severe erectile dysfunction. A complete review of systems is obtained and is otherwise negative.   PHYSICAL EXAM:  Wt Readings from Last 3 Encounters:  10/19/21 185 lb 4 oz (84 kg)  06/30/21 183 lb (83 kg)  05/26/21 183 lb (83 kg)   Temp Readings from Last 3 Encounters:  10/19/21 (!) 96.1 F (35.6 C) (Temporal)  06/30/21 98.6 F (37 C) (Oral)  05/26/21 98.5 F (36.9 C) (Oral)   BP Readings from Last 3 Encounters:  10/19/21 (!) 151/77  06/30/21 120/70  05/26/21 140/80   Pulse Readings from Last 3 Encounters:  10/19/21 62  06/30/21 (!) 55  05/26/21 73    /10  In general this is a well appearing African-American male in no acute distress.  He's alert and oriented x4 and appropriate throughout the examination. Cardiopulmonary assessment is negative for acute distress and he exhibits normal effort.    KPS = 100  100 - Normal; no complaints; no evidence of disease. 90   - Able to carry on normal activity; minor signs or symptoms of disease. 80   - Normal activity with effort; some signs or symptoms of disease. 78   - Cares for self; unable to carry on normal activity or to do active work. 60   - Requires occasional assistance, but is able to care for most of his personal needs. 50   - Requires considerable assistance and frequent medical care. 56   - Disabled; requires special care and assistance. 32   - Severely disabled; hospital admission is indicated although death not imminent. 55   - Very sick; hospital admission necessary; active supportive treatment necessary. 10   - Moribund; fatal processes progressing rapidly. 0     - Dead  Karnofsky DA, Abelmann [place], Craver LS and Burchenal [facility] [phone]) The use of the nitrogen mustards in the palliative treatment of carcinoma: with particular reference to bronchogenic carcinoma Cancer 1 [id number]   LABORATORY DATA:  Lab Results   Component Value Date   WBC 5.8 06/30/2021   HGB 14.7 06/30/2021   HCT 44.7 06/30/2021   MCV 85.9 06/30/2021   PLT 201.0 06/30/2021   Lab Results  Component Value Date   NA 138 06/30/2021   K 4.2 06/30/2021   CL 102 06/30/2021   CO2 29 06/30/2021   Lab Results  Component Value Date   ALT 19 06/30/2021   AST 14 06/30/2021   ALKPHOS 56 06/30/2021   BILITOT 0.6 06/30/2021     RADIOGRAPHY: NM PET (PSMA) SKULL TO MID THIGH  Result Date: 09/24/2021 CLINICAL DATA:  High-grade prostate cancer. Recent biopsy. PSA of 60. EXAM: NUCLEAR MEDICINE PET SKULL BASE TO THIGH TECHNIQUE: 9.6 mCi F18 Piflufolastat (Pylarify) was injected intravenously. Full-ring PET imaging was performed from the skull base to thigh after the radiotracer. CT data was obtained and used for attenuation correction and anatomic localization. COMPARISON:  Abdominopelvic CT 02/22/2004. FINDINGS: NECK No radiotracer activity in neck lymph nodes. Incidental CT finding: No cervical adenopathy. CHEST No radiotracer accumulation within mediastinal or hilar lymph nodes. No suspicious pulmonary nodules on the  CT scan. Incidental CT finding: Aortic and coronary artery calcification. Mild cardiomegaly. ABDOMEN/PELVIS Prostate: Locally advanced prostate primary, with extracapsular extension on 191/4. Measures a S.U.V. max of 70.0. Lymph nodes: Small nodes within the superior aspect of the sigmoid mesocolon including at up to 7 mm and a S.U.V. max of 3.5 on 155/4. Right pelvic sidewall nodes, including at 4 mm and a S.U.V. max of 1.8 on 178/4. More centrally at 1.0 cm and a S.U.V. max of 8.8 on 180/4. Liver: No evidence of liver metastasis Incidental CT finding: Normal adrenal glands. Inter/lower pole subcentimeter right renal cyst. No renal calculi. Normal noncontrast appearance of the liver, gallbladder, pancreas, stomach. Scattered colonic diverticula. Small fat containing bilateral inguinal hernias. SKELETON No focal activity to suggest  skeletal metastasis. Degenerative changes of the right hip. Probable bone island in the left iliac. IMPRESSION: 1. Locally advanced, markedly tracer avid primary with pelvic nodal metastasis. 2. No tracer avid extrapelvic metastatic disease. 3. Incidental findings, including: Coronary artery atherosclerosis. Aortic Atherosclerosis (ICD10-I70.0). Electronically Signed   By: Abigail Miyamoto M.D.   On: 09/24/2021 16:59      IMPRESSION/PLAN: 71 y.o. gentleman with locally advanced adenocarcinoma of the prostate involving pelvic and periaortic lymph nodes, with a Gleason's score of 4+4 and a PSA of 60.7.  We discussed the patient's workup and outlined the nature of prostate cancer in this setting. His PSMA PET scan confirmed locally advanced disease. Accordingly, he is eligible for a variety of potential treatment options including LT-ADT concurrent with either 8 weeks of external radiation or 5 weeks of external radiation with an upfront brachytherapy boost. We discussed the available radiation techniques, and focused on the details and logistics of delivery. The patient is not an ideal candidate for brachytherapy boost with a prostate volume of 84 cc and an IPSS score of 26.  Therefore, we discussed and outlined the risks, benefits, short and long-term effects associated with daily external beam radiotherapy and compared and contrasted these with prostatectomy.  We detailed the role of ADT in the treatment of high risk prostate cancer and outlined the associated side effects that could be expected with this therapy. He was encouraged to ask questions that were answered to his stated satisfaction.  At the end of the conversation, the patient is interested in moving forward with 8 weeks of daily external beam therapy concurrent with LT-ADT. He has already started ADT with a Firmagon injection on 10/08/21 and is scheduled for Lupron ADT on 11/05/2021. We will share our discussion with Dr. Milford Cage and make arrangements  for fiducial marker placement in late April 2023, prior to simulation, in anticipation of beginning his daily radiation treatments approximately 2 months from the start of ADT. Given his high risk disease and tumor abutting the rectum on recent PSMA, he will not have SpaceOAR gel placement. The patient appears to have a good understanding of his disease and our treatment recommendations which are of curative intent and is in agreement with the stated plan.  We enjoyed meeting him today and look forward to continuing to participate in his care.   We personally spent 60 minutes in this encounter including chart review, reviewing radiological studies, meeting face-to-face with the patient, entering orders and completing documentation.    Nicholos Johns, PA-C    Tyler Pita, MD  [facility] Direct Dial: [phone]   Fax: [phone] .com   Skype   LinkedIn   This document serves as a record of services personally  performed by Tyler Pita, MD and Freeman Caldron, PA-C. It was created on their behalf by Wilburn Mylar, a trained medical scribe. The creation of this record is based on the scribe's personal observations and the provider's statements to them. This document has been checked and approved by the attending provider.

## 2021-10-20 NOTE — Progress Notes (Signed)
REFERRING PROVIDER: ?Wyatt Portela, MD ?[address] ?[place],  [place] [address] ? ?PRIMARY PROVIDER:  ?Dorothyann Peng, NP ? ?PRIMARY REASON FOR VISIT:  ?1. Prostate cancer ([place])   ? ? ?HISTORY OF PRESENT ILLNESS:   ?John Stone, a 71 y.o. male, was seen for a [facility] cancer genetics consultation at the request of Dr. Alen Blew due to a personal history of prostate cancer.  John Stone presents to clinic today to discuss the possibility of a hereditary predisposition to cancer, to discuss genetic testing, and to further clarify his future cancer risks, as well as potential cancer risks for family members.  ? ?In 2023, at the age of 78, John Stone was diagnosed with high risk prostate cancer. The treatment plan is pending.  ? ?CANCER HISTORY:  ?Oncology History  ?Prostate cancer [facility])  ?08/27/2021 Cancer Staging  ? Staging form: Prostate, AJCC 8th Edition ?- Clinical stage from 08/27/2021: Stage IVA (cT3a, cN1, cM0, PSA: 60.7, Grade Group: 4) - Signed by Freeman Caldron, PA-C on 10/19/2021 ?Histopathologic type: Adenocarcinoma, NOS ?Stage prefix: Initial diagnosis ?Prostate specific antigen (PSA) range: 20 or greater ?Gleason primary pattern: 4 ?Gleason secondary pattern: 4 ?Gleason score: 8 ?Histologic grading system: 5 grade system ?Number of biopsy cores examined: 12 ?Number of biopsy cores positive: 4 ?Location of positive needle core biopsies: One side ? ?  ?10/19/2021 Initial Diagnosis  ? Prostate cancer ([place]) ?  ? ? ? ?RISK FACTORS:  ?Colonoscopy: yes ? ?Past Medical History:  ?Diagnosis Date  ? Blood in stool   ? BPH (benign prostatic hyperplasia)   ? Chicken pox   ? Hypertension   ? ? ?Past Surgical History:  ?Procedure Laterality Date  ? PROSTATE SURGERY  2007  ? ? ?FAMILY HISTORY:  ?We obtained a detailed, 4-generation family history.  No family history of cancer was reported.  John Stone does not have information about his paternal family.  ? ? ?John Stone is unaware of previous family history of genetic  testing for hereditary cancer risks. There is no reported Ashkenazi Jewish ancestry.  ? ?GENETIC COUNSELING ASSESSMENT: John Stone is a 71 y.o. male with a personal history of prostate cancer which is somewhat suggestive of a hereditary cancer syndrome and predisposition to cancer given the high risk status. We, therefore, discussed and recommended the following at today's visit.  ? ?DISCUSSION: We discussed that 5 - 10% of cancer is hereditary.  Most cases of hereditary prostate cancer are associated with mutations in BRCA1/2.  There are other genes that can be associated with hereditary prostate cancer syndromes.  We discussed that testing is beneficial for several reasons including knowing how to follow individuals for their cancer risks, identifying whether potential treatment options, such as PARP inhibitors, would be beneficial, and understanding if other family members could be at risk for cancer and allowing them to undergo genetic testing.  ? ?We reviewed the characteristics, features and inheritance patterns of hereditary cancer syndromes. We also discussed genetic testing, including the appropriate family members to test, the process of testing, insurance coverage and turn-around-time for results. We discussed the implications of a negative, positive, carrier and/or variant of uncertain significant result. We recommended John Stone pursue genetic testing for a panel that includes genes associated with prostate cancer.  ? ?Based on John Stone personal history of cancer, he meets medical criteria for genetic testing.  ? ?PLAN: John Stone did not wish to pursue genetic testing at today's visit. He stated that he may consider proceeding with  testing at a later time. We understand this decision and remain available to coordinate genetic testing at any time in the future. We, therefore, recommend John Stone continue to follow the cancer screening guidelines given by his primary healthcare provider. ? ?Lastly, we  encouraged John Stone to remain in contact with cancer genetics annually so that we can continuously update the family history and inform him of any changes in cancer genetics and testing that may be of benefit for this family.  ? ?John Stone's questions were answered to his satisfaction today. Our contact information was provided should additional questions or concerns arise. Thank you for the referral and allowing [place] to share in the care of your patient.  ? ?John Stone, [place], [place] ?Genetic Counselor ?[email or web address] ?(P) [phone] ? ?The patient was seen for a total of 15 minutes in face-to-face genetic counseling.  The patient was seen alone.  Drs. Lindi Adie and/or Burr Medico were available to discuss this case as needed.  ? ? ?_______________________________________________________________________ ?For Office Staff:  ?Number of people involved in session: 1 ?Was an Intern/ student involved with case: no ? ?

## 2021-10-20 NOTE — Consult Note (Signed)
Multi-Disciplinary Clinic     10/19/2021   --------------------------------------------------------------------------------   John Stone  MRN: [id number]  DOB: 1951-02-20, 71 year old Male  SSN: [id number]   PRIMARY CARE:  Dorothyann Peng, NP  REFERRING:  Remi Haggard, MD  PROVIDER:  Harold Barban, M.D.  TREATING:  Raynelle Bring, M.D.  LOCATION:  [facility] [phone]     --------------------------------------------------------------------------------   CC/HPI: CC: Prostate Cancer   Physician requesting consult: Dr. Irineo Axon  PCP: Dorothyann Peng, NP  Location of consult: [facility] - Prostate Cancer Multidisciplinary [facility]   John Stone is a 71 year old gentleman with a past medical history significant for hypertension. He has a long standing history of an elevated PSA s/p biopsies by Dr. Gaynelle Arabian in 2000 for an elevated PSA of 6.6 and another biopsy in 2005 for a PSA of 6.34 which were both benign. He did undergo a TURP by Dr. Karsten Ro and was followed by him after Dr. Gaynelle Arabian retired. His PSA increased to 18.35 and remained elevated when repeated in 2019 at 18.4. He was recommended to undergo a prostate biopsy by Dr. Karsten Ro but did not follow up after that. He then presented to Dr. Milford Cage for further evaluation in 2022. His PSA levels in March 2020 had been 30.78 and then 53.75 in March 2021 and then up to 60.69 in December 2022. He was amenable at that time to consider a biopsy and this was performed by Dr. Milford Cage on 08/27/21 and demonstrated Gleason 4+4=8 adenocarcinoma with 4 out of 12 biopsy cores positive for malignancy. PSMA PET scan staging was performed on 09/23/21 indicating uptake in multiple pelvic lymph nodes with probable extraprostatic extension locally but without distant metastases. He received Firmagon 240 mg per Dr. Milford Cage on 2/24.   Family history: None.   Imaging studies: PSMA PET scan (09/23/21): See above.   PMH: He  has a history of hypertension.  PSH: TURP.   TNM stage: cT3a N1 Mx  PSA: 60.69  Gleason score: 4+4=8 (GG 4)  Biopsy (08/27/21): 4/12 cores positive  Left: Benign  Right: R mid (80%, 4+3=7), R lateral mid (80%, 4+3=7), R base (80%, 4+3=7), R lateral base (70%, 4+4=8)  Prostate volume: 83.85   Urinary function: IPSS is 26. He takes silodosin 8 mg.  Erectile function: SHIM score is 11.     ALLERGIES: No Allergies    MEDICATIONS: Levofloxacin 750 mg tablet Take 1 tab po 1 hour prior to procedure  Silodosin 8 mg capsule 1 capsule PO Daily  Amlodipine Besylate  Lisinopril-Hydrochlorothiazide     GU PSH: Cystoscopy TURP Prostate Needle Biopsy - 08/27/2021     NON-GU PSH: Surgical Pathology, Gross And Microscopic Examination For Prostate Needle - 08/27/2021     GU PMH: BPH w/LUTS - 10/08/2021, - 07/30/2021 Incomplete bladder emptying - 10/08/2021, - 07/30/2021 Metastatic pelvic lymphadenopathy - 10/08/2021 Prostate Cancer - 10/08/2021, - 09/03/2021 Elevated PSA - 08/27/2021, (Stable), - 07/30/2021 (Worsening), I told him he does have an enlarged prostate but my concern is that his PSA has increased significantly 1018.35. I therefore have recommended we proceed with a prostate biopsy if this is confirmed to be elevated by repeating the PSA today. For now he is going to stop his daily aspirin until we have determined whether a prostate biopsy is necessary., - 2019, He has elected to proceed with conservative monitoring of his PSA. What I have recommended is that he undergo repeat PSA again in 3 months. I  will then repeat the PSA again in 3 months following that with a DRE at that time and he understands that if his PSA continues to show a significant rise we would need to proceed with a prostate biopsy., - 2017 BPH w/o LUTS, He has fairly marked BPH on examination but has no voiding symptoms. - 2017      PMH Notes: BPH with outlet obstruction: He underwent a TUNA procedure by Dr. Gaynelle Arabian in  4/05.  Simple prostatectomy in 9/05.   When I last saw him I found his PSA at increased to 18 and we discussed repeating that before deciding on the need for further evaluation with another prostate biopsy. He returns today, having had his PSA repeated and it has been confirmed to be elevated currently at 18.4, in order to undergo prostate biopsy.     NON-GU PMH: Hypertension    FAMILY HISTORY: No Family History    SOCIAL HISTORY: Marital Status: Married Preferred Language: English; Ethnicity: Not Hispanic Or Latino; Race: Black or African American Current Smoking Status: Patient has never smoked.  Has never drank.  Drinks 2 caffeinated drinks per day. 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    REVIEW OF SYSTEMS:    GU Review Male:   Patient denies frequent urination, hard to postpone urination, burning/ pain with urination, get up at night to urinate, leakage of urine, stream starts and stops, trouble starting your streams, and have to strain to urinate .  Gastrointestinal (Upper):   Patient denies nausea and vomiting.  Gastrointestinal (Lower):   Patient denies diarrhea and constipation.  Constitutional:   Patient denies fever, night sweats, weight loss, and fatigue.  Skin:   Patient denies skin rash/ lesion and itching.  Eyes:   Patient denies blurred vision and double vision.  Ears/ Nose/ Throat:   Patient denies sore throat and sinus problems.  Hematologic/Lymphatic:   Patient denies swollen glands and easy bruising.  Cardiovascular:   Patient denies leg swelling and chest pains.  Respiratory:   Patient denies cough and shortness of breath.  Endocrine:   Patient denies excessive thirst.  Musculoskeletal:   Patient denies back pain and joint pain.  Neurological:   Patient denies dizziness and headaches.  Psychologic:   Patient denies depression and anxiety.   VITAL SIGNS: None   MULTI-SYSTEM PHYSICAL EXAMINATION:    Constitutional: Well-nourished. No physical deformities.  Normally developed. Good grooming.     Complexity of Data:  Lab Test Review:   PSA  Records Review:   Pathology Reports, Previous Patient Records  X-Ray Review: PET Scan: Reviewed Films.     11/21/17 10/11/17 08/22/16 03/15/16 10/09/03 05/24/99  PSA  Total PSA 18.40 ng/mL 18.35 ng/dl 7.45 ng/dl 8.28 ng/dl 6.34  6.6 ng/dl  Free PSA   0.86 ng/dl  1.20    % Free PSA   12 %  18.9      PROCEDURES: None   ASSESSMENT:      ICD-10 Details  1 GU:   Prostate Cancer - C61   2   Metastatic pelvic lymphadenopathy - C77.5    PLAN:           Document Letter(s):  Created for Patient: Clinical Summary         Notes:   1. Metastatic prostate cancer: I met with Mr. Debes today and discussed his situation in detail. We have reviewed his PET scan results with radiology today and had a multidisciplinary discussion together with Dr. Alen Blew and Dr. Tammi Klippel we have already  seen Mr. Wadsworth earlier this afternoon. At this time, he understands that he does have metastatic prostate cancer that is likely treatable but incurable. However, he may benefit from aggressive therapy considering the minimal burden of metastatic disease at this time. As such, we agreed with having him begin androgen deprivation therapy as has been done per Dr. Milford Cage. Dr. Tammi Klippel has offered him external beam radiation therapy to both the prostate, pelvic lymph nodes, and the periaortic/retroperitoneal lymph nodes as noted on his PSMA PET scan. Dr. Alen Blew also plans to see him back to discuss therapy escalation with androgen synthesis inhibition or androgen receptor blockade. If he does have an excellent response to treatment, it may be reasonable for him to consider the option of discontinuation of systemic therapy in a couple of years to fully assess his response to this aggressive treatment approach. He will keep his appointment with Dr. Milford Cage to continue androgen deprivation therapy in March. Dr. Tammi Klippel will coordinate giving him set up  for fiducial markers although he did not recommend SpaceOAR considering the concern regarding his locally advanced disease and possible involvement of the anterior rectum.   CC: Dorothyann Peng, NP  Dr. Irineo Axon  Dr. Tyler Pita  Dr. Zola Button        Next Appointment:      Next Appointment: 11/05/2021 10:45 AM    Appointment Type: Drug Therapy    Location: [facility] [phone]    Provider: Harold Barban, M.D.    Reason for Visit: 1 mo ELIGARD      E & M CODES: We spent 34 minutes dedicated to evaluation and management time, including face to face interaction, discussions on coordination of care, documentation, result review, and discussion with others as applicable.

## 2021-10-24 DIAGNOSIS — Z191 Hormone sensitive malignancy status: Secondary | ICD-10-CM | POA: Diagnosis not present

## 2021-10-26 ENCOUNTER — Other Ambulatory Visit: Payer: Self-pay | Admitting: Urology

## 2021-10-26 ENCOUNTER — Telehealth: Payer: Self-pay | Admitting: *Deleted

## 2021-10-26 NOTE — Telephone Encounter (Signed)
Called patient to inform of fid. markers and space oar gel placement on 12-29-21 and his sim on 12-31-21 @ [place], spoke with patient and he is aware of these appts. ?

## 2021-11-05 DIAGNOSIS — C775 Secondary and unspecified malignant neoplasm of intrapelvic lymph nodes: Secondary | ICD-10-CM | POA: Diagnosis not present

## 2021-11-11 ENCOUNTER — Encounter: Payer: Self-pay | Admitting: Adult Health

## 2021-11-11 ENCOUNTER — Ambulatory Visit (INDEPENDENT_AMBULATORY_CARE_PROVIDER_SITE_OTHER): Payer: Medicare Other | Admitting: Adult Health

## 2021-11-11 VITALS — BP 138/82 | HR 64 | Temp 98.6°F | Ht 64.0 in | Wt 189.8 lb

## 2021-11-11 DIAGNOSIS — Z0000 Encounter for general adult medical examination without abnormal findings: Secondary | ICD-10-CM | POA: Diagnosis not present

## 2021-11-11 DIAGNOSIS — C61 Malignant neoplasm of prostate: Secondary | ICD-10-CM

## 2021-11-11 DIAGNOSIS — I10 Essential (primary) hypertension: Secondary | ICD-10-CM

## 2021-11-11 DIAGNOSIS — E782 Mixed hyperlipidemia: Secondary | ICD-10-CM | POA: Diagnosis not present

## 2021-11-11 NOTE — Progress Notes (Signed)
? ?Subjective:  ? ? Patient ID: Edahi Kroening, male    DOB: 29-Sep-1950, 71 y.o.   MRN: [id number] ? ?HPI ?Patient presents for yearly preventative medicine examination. He is a pleasant 71 year old male who  has a past medical history of Blood in stool, BPH (benign prostatic hyperplasia), Chicken pox, and Hypertension. ? ?Hypertension-takes lisinopril/HCTZ 20-25 mg daily and Norvasc 5 mg daily.  He denies dizziness, lightheadedness, chest pain, or shortness of breath.  ?BP Readings from Last 3 Encounters:  ?11/11/21 138/82  ?10/19/21 (!) 151/77  ?06/30/21 120/70  ? ?Hyperlipidemia-prescribed Zocor 10 mg daily.  He denies myalgia or fatigue ?Lab Results  ?Component Value Date  ? CHOL 181 11/10/2020  ? HDL 47.10 11/10/2020  ? LDLCALC 123 (H) 11/10/2020  ? TRIG 54.0 11/10/2020  ? CHOLHDL 4 11/10/2020  ? ?Prostate Cancer -has had history of elevated PSA in the past with negative biopsies.  December 2022 and elevated PSA up to 60.69.  He underwent transrectal ultrasound and prostate biopsy on 08/27/2021 which showed 4 out of 12 cores positive for adenocarcinoma of the prostate.  He underwent a PET scan and fortunately shows no evidence of metastatic disease to the pelvic lymph nodes.  He was then treated with androgen deprivation therapy and pelvic radiation.  Then started 6 months of Eligard.  Upcoming he has gold seed placement in May 2023. ?Repeat PET in 2/203 showed ?IMPRESSION: ?1. Locally advanced, markedly tracer avid primary with pelvic nodal ?metastasis. ?2. No tracer avid extrapelvic metastatic disease ? ?All immunizations and health maintenance protocols were reviewed with the patient and needed orders were placed. ? ?Appropriate screening laboratory values were ordered for the patient including screening of hyperlipidemia, renal function and hepatic function. ?If indicated by BPH, a PSA was ordered. ? ?Medication reconciliation,  past medical history, social history, problem list and allergies were  reviewed in detail with the patient ? ?Goals were established with regard to weight loss, exercise, and  diet in compliance with medications ? ?Wt Readings from Last 3 Encounters:  ?11/11/21 189 lb 12.8 oz (86.1 kg)  ?10/19/21 185 lb 4 oz (84 kg)  ?06/30/21 183 lb (83 kg)  ? ?Overall he is doing well and is in great spirits  ? ?Review of Systems  ?Constitutional: Negative.   ?HENT: Negative.    ?Eyes: Negative.   ?Respiratory: Negative.    ?Cardiovascular: Negative.   ?Gastrointestinal: Negative.   ?Endocrine: Negative.   ?Genitourinary: Negative.   ?Musculoskeletal:  Positive for arthralgias and gait problem.  ?Skin: Negative.   ?Allergic/Immunologic: Negative.   ?Hematological: Negative.   ?Psychiatric/Behavioral: Negative.    ?All other systems reviewed and are negative. ? ?Past Medical History:  ?Diagnosis Date  ? Blood in stool   ? BPH (benign prostatic hyperplasia)   ? Chicken pox   ? Hypertension   ? ? ?Social History  ? ?Socioeconomic History  ? Marital status: Married  ?  Spouse name: Not on file  ? Number of children: Not on file  ? Years of education: Not on file  ? Highest education level: Not on file  ?Occupational History  ? Not on file  ?Tobacco Use  ? Smoking status: Never  ? Smokeless tobacco: Never  ?Substance and Sexual Activity  ? Alcohol use: No  ? Drug use: No  ? Sexual activity: Not on file  ?Other Topics Concern  ? Not on file  ?Social History Narrative  ? Married  ? 8 kids   ? 6  live locally.   ?   ? Work in [employer]  ? He likes to stay active in the church.   ? ?Social Determinants of Health  ? ?Financial Resource Strain: Not on file  ?Food Insecurity: Not on file  ?Transportation Needs: Not on file  ?Physical Activity: Not on file  ?Stress: Not on file  ?Social Connections: Not on file  ?Intimate Partner Violence: Not on file  ? ? ?Past Surgical History:  ?Procedure Laterality Date  ? PROSTATE SURGERY  2007  ? ? ?Family History  ?Problem Relation Age of Onset  ? Hypertension  Mother   ?     parent  ? Hypertension Maternal Grandfather   ?     grandparent   ? Colon cancer Neg Hx   ? Colon polyps Neg Hx   ? Esophageal cancer Neg Hx   ? Rectal cancer Neg Hx   ? Stomach cancer Neg Hx   ? ? ?No Known Allergies ? ?Current Outpatient Medications on File Prior to Visit  ?Medication Sig Dispense Refill  ? amLODipine (NORVASC) 5 MG tablet TAKE 1 TABLET(5 MG) BY MOUTH DAILY 90 tablet 3  ? lisinopril-hydrochlorothiazide (ZESTORETIC) 20-25 MG tablet Take 1 tablet by mouth daily. 90 tablet 3  ? simvastatin (ZOCOR) 20 MG tablet Take 0.5 tablets (10 mg total) by mouth daily. 90 tablet 3  ? ?No current facility-administered medications on file prior to visit.  ? ? ?BP 138/82 (BP Location: Left Arm, Patient Position: Sitting, Cuff Size: Normal)   Pulse 64   Temp 98.6 ?F (37 ?C) (Oral)   Ht '5\' 4"'$  (1.626 m)   Wt 189 lb 12.8 oz (86.1 kg)   SpO2 98%   BMI 32.58 kg/m?  ? ? ?   ?Objective:  ? Physical Exam ?Vitals and nursing note reviewed.  ?Constitutional:   ?   General: He is not in acute distress. ?   Appearance: Normal appearance. He is well-developed. He is obese.  ?HENT:  ?   Head: Normocephalic and atraumatic.  ?   Right Ear: Tympanic membrane, ear canal and external ear normal. There is no impacted cerumen.  ?   Left Ear: Tympanic membrane, ear canal and external ear normal. There is no impacted cerumen.  ?   Nose: Nose normal. No congestion or rhinorrhea.  ?   Mouth/Throat:  ?   Mouth: Mucous membranes are moist.  ?   Dentition: Abnormal dentition.  ?   Pharynx: Oropharynx is clear. No oropharyngeal exudate or posterior oropharyngeal erythema.  ?Eyes:  ?   General:     ?   Right eye: No discharge.     ?   Left eye: No discharge.  ?   Extraocular Movements: Extraocular movements intact.  ?   Conjunctiva/sclera: Conjunctivae normal.  ?   Pupils: Pupils are equal, round, and reactive to light.  ?Neck:  ?   Vascular: No carotid bruit.  ?   Trachea: No tracheal deviation.  ?Cardiovascular:  ?   Rate  and Rhythm: Normal rate and regular rhythm.  ?   Pulses: Normal pulses.  ?   Heart sounds: Normal heart sounds. No murmur heard. ?  No friction rub. No gallop.  ?Pulmonary:  ?   Effort: Pulmonary effort is normal. No respiratory distress.  ?   Breath sounds: Normal breath sounds. No stridor. No wheezing, rhonchi or rales.  ?Chest:  ?   Chest wall: No tenderness.  ?Abdominal:  ?   General: Bowel sounds  are normal. There is no distension.  ?   Palpations: Abdomen is soft. There is no mass.  ?   Tenderness: There is no abdominal tenderness. There is no right CVA tenderness, left CVA tenderness, guarding or rebound.  ?   Hernia: No hernia is present.  ?Musculoskeletal:     ?   General: No swelling, tenderness, deformity or signs of injury. Normal range of motion.  ?   Right lower leg: No edema.  ?   Left lower leg: No edema.  ?Lymphadenopathy:  ?   Cervical: No cervical adenopathy.  ?Skin: ?   General: Skin is warm and dry.  ?   Capillary Refill: Capillary refill takes less than 2 seconds.  ?   Coloration: Skin is not jaundiced or pale.  ?   Findings: No bruising, erythema, lesion or rash.  ?Neurological:  ?   General: No focal deficit present.  ?   Mental Status: He is alert and oriented to person, place, and time.  ?   Cranial Nerves: No cranial nerve deficit.  ?   Sensory: No sensory deficit.  ?   Motor: No weakness.  ?   Coordination: Coordination normal.  ?   Gait: Gait abnormal (walks with cane do to OA in hip).  ?   Deep Tendon Reflexes: Reflexes normal.  ?Psychiatric:     ?   Mood and Affect: Mood normal.     ?   Behavior: Behavior normal.     ?   Thought Content: Thought content normal.     ?   Judgment: Judgment normal.  ? ?   ?Assessment & Plan:  ?1. Routine general medical examination at a health care facility ?- Follow up next year or sooner if needed ?- CBC with Differential/Platelet; Future ?- Comprehensive metabolic panel; Future ?- Hemoglobin A1c; Future ?- Lipid panel; Future ?- TSH; Future ? ?2. Mixed  hyperlipidemia ?- Consider increase in statin  ?- CBC with Differential/Platelet; Future ?- Comprehensive metabolic panel; Future ?- Hemoglobin A1c; Future ?- Lipid panel; Future ?- TSH; Future ? ?3. Essential hype

## 2021-11-11 NOTE — Patient Instructions (Signed)
It was great seeing you today   We will follow up with you regarding your lab work   Please let me know if you need anything   

## 2021-11-16 ENCOUNTER — Other Ambulatory Visit (INDEPENDENT_AMBULATORY_CARE_PROVIDER_SITE_OTHER): Payer: Medicare Other

## 2021-11-16 DIAGNOSIS — Z0000 Encounter for general adult medical examination without abnormal findings: Secondary | ICD-10-CM | POA: Diagnosis not present

## 2021-11-16 DIAGNOSIS — I10 Essential (primary) hypertension: Secondary | ICD-10-CM | POA: Diagnosis not present

## 2021-11-16 DIAGNOSIS — E782 Mixed hyperlipidemia: Secondary | ICD-10-CM

## 2021-11-16 LAB — COMPREHENSIVE METABOLIC PANEL
ALT: 26 U/L (ref 0–53)
AST: 19 U/L (ref 0–37)
Albumin: 4.2 g/dL (ref 3.5–5.2)
Alkaline Phosphatase: 53 U/L (ref 39–117)
BUN: 16 mg/dL (ref 6–23)
CO2: 29 mEq/L (ref 19–32)
Calcium: 9.7 mg/dL (ref 8.4–10.5)
Chloride: 102 mEq/L (ref 96–112)
Creatinine, Ser: 0.92 mg/dL (ref 0.40–1.50)
GFR: 83.58 mL/min (ref >60.00)
Glucose, Bld: 91 mg/dL (ref 70–99)
Potassium: 4.2 mEq/L (ref 3.5–5.1)
Sodium: 139 mEq/L (ref 135–145)
Total Bilirubin: 0.4 mg/dL (ref 0.2–1.2)
Total Protein: 7.4 g/dL (ref 6.0–8.3)

## 2021-11-16 LAB — CBC WITH DIFFERENTIAL/PLATELET
Basophils Absolute: 0 10*3/uL (ref 0.0–0.1)
Basophils Relative: 0.9 % (ref 0.0–3.0)
Eosinophils Absolute: 0.3 10*3/uL (ref 0.0–0.7)
Eosinophils Relative: 4.9 % (ref 0.0–5.0)
HCT: 40.9 % (ref 39.0–52.0)
Hemoglobin: 13.5 g/dL (ref 13.0–17.0)
Lymphocytes Relative: 26.6 % (ref 12.0–46.0)
Lymphs Abs: 1.5 10*3/uL (ref 0.7–4.0)
MCHC: 32.9 g/dL (ref 30.0–36.0)
MCV: 84.8 fl (ref 78.0–100.0)
Monocytes Absolute: 0.5 10*3/uL (ref 0.1–1.0)
Monocytes Relative: 9.1 % (ref 3.0–12.0)
Neutro Abs: 3.2 10*3/uL (ref 1.4–7.7)
Neutrophils Relative %: 58.5 % (ref 43.0–77.0)
Platelets: 187 10*3/uL (ref 150.0–400.0)
RBC: 4.83 Mil/uL (ref 4.22–5.81)
RDW: 13.7 % (ref 11.5–15.5)
WBC: 5.5 10*3/uL (ref 4.0–10.5)

## 2021-11-16 LAB — LIPID PANEL
Cholesterol: 173 mg/dL (ref 0–200)
HDL: 52.4 mg/dL (ref >39.00)
LDL Cholesterol: 111 mg/dL — ABNORMAL HIGH (ref 0–99)
NonHDL: 120.6
Total CHOL/HDL Ratio: 3
Triglycerides: 47 mg/dL (ref 0.0–149.0)
VLDL: 9.4 mg/dL (ref 0.0–40.0)

## 2021-11-16 LAB — TSH: TSH: 1.13 u[IU]/mL (ref 0.35–5.50)

## 2021-11-16 LAB — HEMOGLOBIN A1C: Hgb A1c MFr Bld: 6.6 % — ABNORMAL HIGH (ref 4.6–6.5)

## 2021-11-17 ENCOUNTER — Other Ambulatory Visit: Payer: Self-pay | Admitting: Adult Health

## 2021-11-17 DIAGNOSIS — I10 Essential (primary) hypertension: Secondary | ICD-10-CM

## 2021-11-17 MED ORDER — SIMVASTATIN 20 MG PO TABS: 20.0000 mg | ORAL_TABLET | Freq: Every day | ORAL | 3 refills | Status: DC

## 2021-11-17 MED ORDER — LISINOPRIL-HYDROCHLOROTHIAZIDE 20-25 MG PO TABS: 1.0000 | ORAL_TABLET | Freq: Every day | ORAL | 3 refills | Status: DC

## 2021-11-19 ENCOUNTER — Telehealth: Payer: Self-pay | Admitting: Oncology

## 2021-11-19 NOTE — Telephone Encounter (Signed)
Called patient regarding upcoming appointments, patient is notified. 

## 2021-11-22 ENCOUNTER — Inpatient Hospital Stay: Payer: Medicare Other | Admitting: Oncology

## 2021-11-22 ENCOUNTER — Inpatient Hospital Stay: Payer: Medicare Other | Attending: Oncology

## 2021-11-22 ENCOUNTER — Other Ambulatory Visit: Payer: Self-pay

## 2021-11-22 VITALS — BP 138/77 | HR 80 | Temp 98.0°F | Resp 16 | Wt 189.0 lb

## 2021-11-22 DIAGNOSIS — R232 Flushing: Secondary | ICD-10-CM | POA: Diagnosis not present

## 2021-11-22 DIAGNOSIS — C61 Malignant neoplasm of prostate: Secondary | ICD-10-CM | POA: Diagnosis present

## 2021-11-22 DIAGNOSIS — C778 Secondary and unspecified malignant neoplasm of lymph nodes of multiple regions: Secondary | ICD-10-CM | POA: Insufficient documentation

## 2021-11-22 DIAGNOSIS — Z79899 Other long term (current) drug therapy: Secondary | ICD-10-CM | POA: Diagnosis not present

## 2021-11-22 DIAGNOSIS — R5383 Other fatigue: Secondary | ICD-10-CM | POA: Insufficient documentation

## 2021-11-22 LAB — CBC WITH DIFFERENTIAL (CANCER CENTER ONLY)
Abs Immature Granulocytes: 0.01 10*3/uL (ref 0.00–0.07)
Basophils Absolute: 0 10*3/uL (ref 0.0–0.1)
Basophils Relative: 1 %
Eosinophils Absolute: 0.1 10*3/uL (ref 0.0–0.5)
Eosinophils Relative: 2 %
HCT: 41.2 % (ref 39.0–52.0)
Hemoglobin: 13.7 g/dL (ref 13.0–17.0)
Immature Granulocytes: 0 %
Lymphocytes Relative: 31 %
Lymphs Abs: 1.8 10*3/uL (ref 0.7–4.0)
MCH: 28.1 pg (ref 26.0–34.0)
MCHC: 33.3 g/dL (ref 30.0–36.0)
MCV: 84.6 fL (ref 80.0–100.0)
Monocytes Absolute: 0.6 10*3/uL (ref 0.1–1.0)
Monocytes Relative: 11 %
Neutro Abs: 3.3 10*3/uL (ref 1.7–7.7)
Neutrophils Relative %: 55 %
Platelet Count: 199 10*3/uL (ref 150–400)
RBC: 4.87 MIL/uL (ref 4.22–5.81)
RDW: 13.6 % (ref 11.5–15.5)
WBC Count: 5.9 10*3/uL (ref 4.0–10.5)
nRBC: 0 % (ref 0.0–0.2)

## 2021-11-22 LAB — CMP (CANCER CENTER ONLY)
ALT: 31 U/L (ref 0–44)
AST: 20 U/L (ref 15–41)
Albumin: 4.1 g/dL (ref 3.5–5.0)
Alkaline Phosphatase: 55 U/L (ref 38–126)
Anion gap: 5 (ref 5–15)
BUN: 23 mg/dL (ref 8–23)
CO2: 30 mmol/L (ref 22–32)
Calcium: 9.7 mg/dL (ref 8.9–10.3)
Chloride: 102 mmol/L (ref 98–111)
Creatinine: 0.99 mg/dL (ref 0.61–1.24)
GFR, Estimated: >60 mL/min (ref >60)
Glucose, Bld: 95 mg/dL (ref 70–99)
Potassium: 4.2 mmol/L (ref 3.5–5.1)
Sodium: 137 mmol/L (ref 135–145)
Total Bilirubin: 0.5 mg/dL (ref 0.3–1.2)
Total Protein: 7.6 g/dL (ref 6.5–8.1)

## 2021-11-22 NOTE — Progress Notes (Signed)
Hematology and Oncology Follow Up Visit ? ?John Stone ?[id number] ?[birth year] 71 y.o. ?11/22/2021 9:34 AM ?Deveron Furlong, NP  ? ?Principle Diagnosis: 71 year old with castration-sensitive advanced prostate cancer with pelvic adenopathy including left para-aortic lymph node in diagnosed January 2023.  He presented with a PSA of 16 and a Gleason score of 8 ? ? ?Prior Therapy: ? ?He status post prostate biopsy completed on August 27, 2021 showed a Gleason score 4+4 = 8 and 70% of the cores as well as Gleason score 7.  ? ?Current therapy: Androgen deprivation therapy under the care of Dr. Milford Cage.  He is on Eligard every 6 months. ? ?Under consideration to start radiation therapy in the near future. ? ?Interim History: John Stone returns today for a follow-up visit.  Since the last visit, he has started on Eligard under the care of Dr. Milford Cage and scheduled to have simulation for radiation treatment next month.  He had tolerated the injection without any complications.  He does report some fatigue and hot flashes but otherwise no other complaints.  He denies any nausea, vomiting or abdominal pain.  He denies any hospitalizations. ? ? ?Medications: I have reviewed the patient's current medications.  ?Current Outpatient Medications  ?Medication Sig Dispense Refill  ? amLODipine (NORVASC) 5 MG tablet TAKE 1 TABLET(5 MG) BY MOUTH DAILY 90 tablet 3  ? lisinopril-hydrochlorothiazide (ZESTORETIC) 20-25 MG tablet Take 1 tablet by mouth daily. 90 tablet 3  ? simvastatin (ZOCOR) 20 MG tablet Take 1 tablet (20 mg total) by mouth daily. 90 tablet 3  ? ?No current facility-administered medications for this visit.  ? ? ? ?Allergies: No Known Allergies ? ?Physical Exam: ?Blood pressure 138/77, pulse 80, temperature 98 ?F (36.7 ?C), temperature source Oral, resp. rate 16, weight 189 lb (85.7 kg), SpO2 100 %. ?ECOG: 1 ?General appearance: alert and cooperative appeared without distress. ?Head: Normocephalic,  without obvious abnormality ?Oropharynx: No oral thrush or ulcers. ?Eyes: No scleral icterus.  Pupils are equal and round reactive to light. ?Lymph nodes: Cervical, supraclavicular, and axillary nodes normal. ?Heart:regular rate and rhythm, S1, S2 normal, no murmur, click, rub or gallop ?Lung:chest clear, no wheezing, rales, normal symmetric air entry ?Abdomin: soft, non-tender, without masses or organomegaly. ?Neurological: No motor, sensory deficits.  Intact deep tendon reflexes. ?Skin: No rashes or lesions.  No ecchymosis or petechiae. ?Musculoskeletal: No joint deformity or effusion. ?Psychiatric: Mood and affect are appropriate. ? ? ? ?Lab Results: ?Lab Results  ?Component Value Date  ? WBC 5.9 11/22/2021  ? HGB 13.7 11/22/2021  ? HCT 41.2 11/22/2021  ? MCV 84.6 11/22/2021  ? PLT 199 11/22/2021  ? ?  Chemistry   ?   ?Component Value Date/Time  ? NA 137 11/22/2021 0838  ? K 4.2 11/22/2021 0838  ? CL 102 11/22/2021 0838  ? CO2 30 11/22/2021 0838  ? BUN 23 11/22/2021 0838  ? CREATININE 0.99 11/22/2021 0838  ?    ?Component Value Date/Time  ? CALCIUM 9.7 11/22/2021 0838  ? ALKPHOS 55 11/22/2021 0838  ? AST 20 11/22/2021 0838  ? ALT 31 11/22/2021 0838  ? BILITOT 0.5 11/22/2021 9562  ?  ? ? ? ? ?Impression and Plan: ? ? ?71 year old with: ? ?1.  Castration-sensitive advanced prostate cancer diagnosed in January 2023 with a PSA of 60, Gleason score of 4+4 = 8.  He was found to have pelvic adenopathy including left para-aortic lymph node. ?  ?The natural course of his disease was reviewed again and  treatment choices were reiterated.  Therapy escalation options including systemic chemotherapy, androgen receptor pathway inhibitors were reiterated.  Risks and benefits of abiraterone with prednisone were discussed today in detail.  These complications that include nausea, fatigue, edema, hypokalemia and hypertension were reiterated.  Duration of therapy will be at least 2 years possibly indefinitely if he continues to have  advanced disease.  Complications related to prednisone were also discussed.   ? ?After discussion today, he will consider this option and let me know in the near future. ?  ?2.  Local therapy: He is scheduled to have definitive radiation therapy to the prostate and regional lymph nodes in the near future.  I agree with this approach at this time. ? ? ?3.  Androgen deprivation therapy: He is currently receiving that under the care of Dr. Milford Cage which he will continue to receive for the next 2 years. ?  ?4.  Follow-up: Will be determined pending his preference to proceed with abiraterone and prednisone. ?  ?30  minutes were spent on this encounter.  The time was dedicated to reviewing laboratory data, disease status update, treatment choices and complication leading to therapy. ?  ?  ?  ?  ? A copy of this consult has been forwarded to the requesting physician. ?  ? ? ?Zola Button, MD ?4/10/20239:34 AM ? ?

## 2021-11-23 LAB — PROSTATE-SPECIFIC AG, SERUM (LABCORP): Prostate Specific Ag, Serum: 4.1 ng/mL — ABNORMAL HIGH (ref 0.0–4.0)

## 2021-11-30 ENCOUNTER — Telehealth: Payer: Self-pay

## 2021-11-30 NOTE — Telephone Encounter (Signed)
RN place call, patient unavailable at this time.  Pt's wife will have him call back, direct number provided.  ?

## 2021-12-13 DIAGNOSIS — Z01818 Encounter for other preprocedural examination: Secondary | ICD-10-CM | POA: Diagnosis not present

## 2021-12-23 ENCOUNTER — Ambulatory Visit (INDEPENDENT_AMBULATORY_CARE_PROVIDER_SITE_OTHER): Payer: Medicare Other

## 2021-12-23 VITALS — BP 120/64 | HR 67 | Temp 98.2°F | Ht 64.0 in | Wt 187.2 lb

## 2021-12-23 DIAGNOSIS — Z0000 Encounter for general adult medical examination without abnormal findings: Secondary | ICD-10-CM | POA: Diagnosis not present

## 2021-12-23 NOTE — Patient Instructions (Addendum)
?John Stone , ?Thank you for taking time to come for your Medicare Wellness Visit. I appreciate your ongoing commitment to your health goals. Please review the following plan we discussed and let me know if I can assist you in the future.  ? ?These are the goals we discussed: ? Goals   ? ?   Patient Stated   ?   Patient plans to keep working ? ?  ?   Patient stated (pt-stated)   ?   I want to make more money and get into real estate. ?  ? ?  ?  ?This is a list of the screening recommended for you and due dates:  ?Health Maintenance  ?Topic Date Due  ? COVID-19 Vaccine (3 - Pfizer risk series) 01/08/2022*  ? Zoster (Shingles) Vaccine (1 of 2) 03/25/2022*  ? Tetanus Vaccine  12/24/2022*  ? Flu Shot  03/15/2022  ? Cologuard (Stool DNA test)  11/25/2023  ? Pneumonia Vaccine  Completed  ? Hepatitis C Screening: USPSTF Recommendation to screen - Ages 32-79 yo.  Completed  ? HPV Vaccine  Aged Out  ?*Topic was postponed. The date shown is not the original due date.  ?  ? ?Advanced directives: No ? ?Conditions/risks identified: None ? ?Next appointment: Follow up in one year for your annual wellness visit.  ? ?Preventive Care 51 Years and Older, Male ?Preventive care refers to lifestyle choices and visits with your health care provider that can promote health and wellness. ?What does preventive care include? ?A yearly physical exam. This is also called an annual well check. ?Dental exams once or twice a year. ?Routine eye exams. Ask your health care provider how often you should have your eyes checked. ?Personal lifestyle choices, including: ?Daily care of your teeth and gums. ?Regular physical activity. ?Eating a healthy diet. ?Avoiding tobacco and drug use. ?Limiting alcohol use. ?Practicing safe sex. ?Taking low doses of aspirin every day. ?Taking vitamin and mineral supplements as recommended by your health care provider. ?What happens during an annual well check? ?The services and screenings done by your health care  provider during your annual well check will depend on your age, overall health, lifestyle risk factors, and family history of disease. ?Counseling  ?Your health care provider may ask you questions about your: ?Alcohol use. ?Tobacco use. ?Drug use. ?Emotional well-being. ?Home and relationship well-being. ?Sexual activity. ?Eating habits. ?History of falls. ?Memory and ability to understand (cognition). ?Work and work Statistician. ?Screening  ?You may have the following tests or measurements: ?Height, weight, and BMI. ?Blood pressure. ?Lipid and cholesterol levels. These may be checked every 5 years, or more frequently if you are over 43 years old. ?Skin check. ?Lung cancer screening. You may have this screening every year starting at age 82 if you have a 30-pack-year history of smoking and currently smoke or have quit within the past 15 years. ?Fecal occult blood test (FOBT) of the stool. You may have this test every year starting at age 50. ?Flexible sigmoidoscopy or colonoscopy. You may have a sigmoidoscopy every 5 years or a colonoscopy every 10 years starting at age 36. ?Prostate cancer screening. Recommendations will vary depending on your family history and other risks. ?Hepatitis C blood test. ?Hepatitis B blood test. ?Sexually transmitted disease (STD) testing. ?Diabetes screening. This is done by checking your blood sugar (glucose) after you have not eaten for a while (fasting). You may have this done every 1-3 years. ?Abdominal aortic aneurysm (AAA) screening. You may need this if  you are a current or former smoker. ?Osteoporosis. You may be screened starting at age 37 if you are at high risk. ?Talk with your health care provider about your test results, treatment options, and if necessary, the need for more tests. ?Vaccines  ?Your health care provider may recommend certain vaccines, such as: ?Influenza vaccine. This is recommended every year. ?Tetanus, diphtheria, and acellular pertussis (Tdap, Td)  vaccine. You may need a Td booster every 10 years. ?Zoster vaccine. You may need this after age 62. ?Pneumococcal 13-valent conjugate (PCV13) vaccine. One dose is recommended after age 51. ?Pneumococcal polysaccharide (PPSV23) vaccine. One dose is recommended after age 60. ?Talk to your health care provider about which screenings and vaccines you need and how often you need them. ?This information is not intended to replace advice given to you by your health care provider. Make sure you discuss any questions you have with your health care provider. ?Document Released: 08/28/2015 Document Revised: 04/20/2016 Document Reviewed: 06/02/2015 ?Elsevier Interactive Patient Education ? 2017 [place]. ? ?Fall Prevention in the Home ?Falls can cause injuries. They can happen to people of all ages. There are many things you can do to make your home safe and to help prevent falls. ?What can I do on the outside of my home? ?Regularly fix the edges of walkways and driveways and fix any cracks. ?Remove anything that might make you trip as you walk through a door, such as a raised step or threshold. ?Trim any bushes or trees on the path to your home. ?Use bright outdoor lighting. ?Clear any walking paths of anything that might make someone trip, such as rocks or tools. ?Regularly check to see if handrails are loose or broken. Make sure that both sides of any steps have handrails. ?Any raised decks and porches should have guardrails on the edges. ?Have any leaves, snow, or ice cleared regularly. ?Use sand or salt on walking paths during winter. ?Clean up any spills in your garage right away. This includes oil or grease spills. ?What can I do in the bathroom? ?Use night lights. ?Install grab bars by the toilet and in the tub and shower. Do not use towel bars as grab bars. ?Use non-skid mats or decals in the tub or shower. ?If you need to sit down in the shower, use a plastic, non-slip stool. ?Keep the floor dry. Clean up any  water that spills on the floor as soon as it happens. ?Remove soap buildup in the tub or shower regularly. ?Attach bath mats securely with double-sided non-slip rug tape. ?Do not have throw rugs and other things on the floor that can make you trip. ?What can I do in the bedroom? ?Use night lights. ?Make sure that you have a light by your bed that is easy to reach. ?Do not use any sheets or blankets that are too big for your bed. They should not hang down onto the floor. ?Have a firm chair that has side arms. You can use this for support while you get dressed. ?Do not have throw rugs and other things on the floor that can make you trip. ?What can I do in the kitchen? ?Clean up any spills right away. ?Avoid walking on wet floors. ?Keep items that you use a lot in easy-to-reach places. ?If you need to reach something above you, use a strong step stool that has a grab bar. ?Keep electrical cords out of the way. ?Do not use floor polish or wax that makes floors slippery. If  you must use wax, use non-skid floor wax. ?Do not have throw rugs and other things on the floor that can make you trip. ?What can I do with my stairs? ?Do not leave any items on the stairs. ?Make sure that there are handrails on both sides of the stairs and use them. Fix handrails that are broken or loose. Make sure that handrails are as long as the stairways. ?Check any carpeting to make sure that it is firmly attached to the stairs. Fix any carpet that is loose or worn. ?Avoid having throw rugs at the top or bottom of the stairs. If you do have throw rugs, attach them to the floor with carpet tape. ?Make sure that you have a light switch at the top of the stairs and the bottom of the stairs. If you do not have them, ask someone to add them for you. ?What else can I do to help prevent falls? ?Wear shoes that: ?Do not have high heels. ?Have rubber bottoms. ?Are comfortable and fit you well. ?Are closed at the toe. Do not wear sandals. ?If you use a  stepladder: ?Make sure that it is fully opened. Do not climb a closed stepladder. ?Make sure that both sides of the stepladder are locked into place. ?Ask someone to hold it for you, if possible. ?Clearly mark and m

## 2021-12-23 NOTE — Progress Notes (Signed)
? ?Subjective:  ? John Stone is a 71 y.o. male who presents for Medicare Annual/Subsequent preventive examination. ? ?Review of Systems    ? ?Cardiac Risk Factors include: advanced age (>74mn, >>46women);hypertension;male gender;diabetes mellitus ? ?   ?Objective:  ?  ?Today's Vitals  ? 12/23/21 0816  ?BP: 120/64  ?Pulse: 67  ?Temp: 98.2 ?F (36.8 ?C)  ?TempSrc: Oral  ?SpO2: 98%  ?Weight: 187 lb 3.2 oz (84.9 kg)  ?Height: '5\' 4"'$  (1.626 m)  ? ?Body mass index is 32.13 kg/m?. ? ? ?  12/23/2021  ?  8:37 AM 09/30/2020  ?  9:27 AM 10/11/2017  ? 10:18 AM 06/27/2016  ?  3:50 PM 04/01/2016  ?  8:05 AM 06/29/2015  ?  4:17 PM  ?Advanced Directives  ?Does Patient Have a Medical Advance Directive? No No No No No No  ?Would patient like information on creating a medical advance directive? No - Patient declined No - Patient declined  No - patient declined information  No - patient declined information  ? ? ?Current Medications (verified) ?Outpatient Encounter Medications as of 12/23/2021  ?Medication Sig  ? amLODipine (NORVASC) 5 MG tablet TAKE 1 TABLET(5 MG) BY MOUTH DAILY  ? lisinopril-hydrochlorothiazide (ZESTORETIC) 20-25 MG tablet Take 1 tablet by mouth daily.  ? simvastatin (ZOCOR) 20 MG tablet Take 1 tablet (20 mg total) by mouth daily.  ? ?No facility-administered encounter medications on file as of 12/23/2021.  ? ? ?Allergies (verified) ?Patient has no known allergies.  ? ?History: ?Past Medical History:  ?Diagnosis Date  ? Blood in stool   ? BPH (benign prostatic hyperplasia)   ? Chicken pox   ? Hypertension   ? ?Past Surgical History:  ?Procedure Laterality Date  ? PROSTATE SURGERY  2007  ? ?Family History  ?Problem Relation Age of Onset  ? Hypertension Mother   ?     parent  ? Hypertension Maternal Grandfather   ?     grandparent   ? Colon cancer Neg Hx   ? Colon polyps Neg Hx   ? Esophageal cancer Neg Hx   ? Rectal cancer Neg Hx   ? Stomach cancer Neg Hx   ? ?Social History  ? ?Socioeconomic History  ? Marital  status: Married  ?  Spouse name: Not on file  ? Number of children: Not on file  ? Years of education: Not on file  ? Highest education level: Not on file  ?Occupational History  ? Not on file  ?Tobacco Use  ? Smoking status: Never  ? Smokeless tobacco: Never  ?Substance and Sexual Activity  ? Alcohol use: No  ? Drug use: No  ? Sexual activity: Not on file  ?Other Topics Concern  ? Not on file  ?Social History Narrative  ? Married  ? 8 kids   ? 6 live locally.   ?   ? Work in [employer] ? He likes to stay active in the church.   ? ?Social Determinants of Health  ? ?Financial Resource Strain: Low Risk   ? Difficulty of Paying Living Expenses: Not hard at all  ?Food Insecurity: No Food Insecurity  ? Worried About RCharity fundraiserin the Last Year: Never true  ? Ran Out of Food in the Last Year: Never true  ?Transportation Needs: No Transportation Needs  ? Lack of Transportation (Medical): No  ? Lack of Transportation (Non-Medical): No  ?Physical Activity: Inactive  ? Days of Exercise per Week: 0  days  ? Minutes of Exercise per Session: 0 min  ?Stress: No Stress Concern Present  ? Feeling of Stress : Not at all  ?Social Connections: Socially Integrated  ? Frequency of Communication with Friends and Family: More than three times a week  ? Frequency of Social Gatherings with Friends and Family: More than three times a week  ? Attends Religious Services: More than 4 times per year  ? Active Member of Clubs or Organizations: Yes  ? Attends Archivist Meetings: More than 4 times per year  ? Marital Status: Married  ? ? ? ?Clinical Intake: ?Nutrition Risk Assessment: ? ?Has the patient had any N/V/D within the last 2 months?  No  ?Does the patient have any non-healing wounds?  No  ?Has the patient had any unintentional weight loss or weight gain?  No  ? ?Diabetes: ? ?Is the patient diabetic?  Pre diabetic ?If diabetic, was a CBG obtained today?  No  ?Did the patient bring in their glucometer from home?   No  ?How often do you monitor your CBG's? PRN.  ? ?Financial Strains and Diabetes Management: ? ?Are you having any financial strains with the device, your supplies or your medication? No .  ?Does the patient want to be seen by Chronic Care Management for management of their diabetes?  No  ?Would the patient like to be referred to a Nutritionist or for Diabetic Management?  No  ? ? ?Diabetic?  Yes Pre Diabetic ? ?Interpreter Needed?: No ? ? ?Activities of Daily Living ? ?  12/23/2021  ?  8:33 AM  ?In your present state of health, do you have any difficulty performing the following activities:  ?Hearing? 0  ?Vision? 0  ?Difficulty concentrating or making decisions? 0  ?Walking or climbing stairs? 0  ?Comment Uses cane  ?Dressing or bathing? 0  ?Doing errands, shopping? 0  ?Preparing Food and eating ? N  ?Using the Toilet? N  ?In the past six months, have you accidently leaked urine? N  ?Do you have problems with loss of bowel control? N  ?Managing your Medications? N  ?Managing your Finances? N  ?Housekeeping or managing your Housekeeping? N  ? ? ?Patient Care Team: ?Dorothyann Peng, NP as PCP - General (Family Medicine) ?Kathie Rhodes, MD (Inactive) as Consulting Physician (Urology) ?[facility] (Specialist) ?Katheren Puller, RN as Oncology Nurse Navigator ? ?Indicate any recent Medical Services you may have received from other than [facility] providers in the past year (date may be approximate). ? ?   ?Assessment:  ? This is a routine wellness examination for John Stone. ? ?Hearing/Vision screen ?Hearing Screening - Comments:: No hearing difficulty ?Vision Screening - Comments:: Wears reading glasses ? ?Dietary issues and exercise activities discussed: ?Exercise limited by: None identified ? ? Goals Addressed   ? ?  ?  ?  ?  ?  ? This Visit's Progress  ?   Patient stated (pt-stated)     ?   I want to make more money and get into real estate. ?  ? ?  ? ?Depression Screen ? ?  12/23/2021  ?  8:30 AM 11/11/2021  ?  7:34 AM  09/30/2020  ?  9:29 AM 06/09/2020  ?  7:38 AM 10/16/2018  ? 10:09 AM 10/11/2017  ? 10:23 AM 10/11/2017  ?  9:31 AM  ?PHQ 2/9 Scores  ?PHQ - 2 Score 0 1 0 0 0 0 0  ?PHQ- 9 Score 0 2       ?  ?  Fall Risk ? ?  12/23/2021  ?  8:35 AM 09/30/2020  ?  9:28 AM 06/09/2020  ?  7:38 AM 10/16/2018  ? 10:09 AM 10/11/2017  ? 10:23 AM  ?Fall Risk   ?Falls in the past year? 1 0 0 0 No  ?Number falls in past yr: 0 0 0    ?Injury with Fall? 0 0 0    ?Comment No injury or medical attention needed      ?Risk for fall due to : No Fall Risks No Fall Risks No Fall Risks    ?Follow up  Falls evaluation completed;Falls prevention discussed Falls evaluation completed    ? ? ?FALL RISK PREVENTION PERTAINING TO THE HOME: ? ?Any stairs in or around the home? Yes  ?If so, are there any without handrails? No  ?Home free of loose throw rugs in walkways, pet beds, electrical cords, etc? Yes  ?Adequate lighting in your home to reduce risk of falls? Yes  ? ?ASSISTIVE DEVICES UTILIZED TO PREVENT FALLS: ? ?Life alert? No  ?Use of a cane, walker or w/c? Yes  ?Grab bars in the bathroom? No  ?Shower chair or bench in shower? No  ?Elevated toilet seat or a handicapped toilet? No  ? ?TIMED UP AND GO: ? ?Was the test performed? Yes .  ?Length of time to ambulate 10 feet: 5 sec.  ? ?Gait steady with use of device ? ?Cognitive Function: ?  ?  ? ?  12/23/2021  ?  8:37 AM  ?6CIT Screen  ?What Year? 0 points  ?What month? 0 points  ?What time? 0 points  ?Count back from 20 0 points  ?Months in reverse 0 points  ?Repeat phrase 0 points  ?Total Score 0 points  ? ? ?Immunizations ?Immunization History  ?Administered Date(s) Administered  ? Fluad Quad(high Dose 65+) 05/07/2019, 06/09/2020, 05/26/2021  ? Influenza, High Dose Seasonal PF 09/28/2017, 06/12/2018  ? PFIZER(Purple Top)SARS-COV-2 Vaccination 10/24/2019, 11/18/2019  ? Pneumococcal Conjugate-13 10/16/2018  ? Pneumococcal Polysaccharide-23 06/09/2020  ? ? ?TDAP status: Due, Education has been provided regarding the  importance of this vaccine. Advised may receive this vaccine at local pharmacy or Health Dept. Aware to provide a copy of the vaccination record if obtained from local pharmacy or Health Dept. Verbalized acceptance

## 2021-12-24 ENCOUNTER — Encounter (HOSPITAL_BASED_OUTPATIENT_CLINIC_OR_DEPARTMENT_OTHER): Payer: Self-pay | Admitting: Urology

## 2021-12-24 ENCOUNTER — Other Ambulatory Visit: Payer: Self-pay

## 2021-12-24 NOTE — Progress Notes (Signed)
Spoke w/ via phone for pre-op interview--- pt ?Lab needs dos----  istat             ?Lab results------ current ekg in epic/ chart ?COVID test -----patient states asymptomatic no test needed ?Arrive at ------- 0730 on 12-29-2021 ?NPO after MN NO Solid Food.  Clear liquids from MN until--- 0630 ?Med rec completed ?Medications to take morning of surgery ----- rapaflo, zocor, norvasc ?Diabetic medication ----- n/a ?Patient instructed no nail polish to be worn day of surgery ?Patient instructed to bring photo id and insurance card day of surgery ?Patient aware to have Driver (ride ) / caregiver  for 24 hours after surgery --wife, ethel ?Patient Special Instructions ----- will do one fleet enema night before surgery ?Pre-Op special Istructions ----- n/a ?Patient verbalized understanding of instructions that were given at this phone interview. ?Patient denies shortness of breath, chest pain, fever, cough at this phone interview.  ?

## 2021-12-29 ENCOUNTER — Encounter (HOSPITAL_BASED_OUTPATIENT_CLINIC_OR_DEPARTMENT_OTHER)
Admission: RE | Disposition: A | Discharge status: Home / Self Care | Payer: Self-pay | Source: Home / Self Care | Attending: Urology

## 2021-12-29 ENCOUNTER — Encounter (HOSPITAL_BASED_OUTPATIENT_CLINIC_OR_DEPARTMENT_OTHER): Payer: Self-pay | Admitting: Urology

## 2021-12-29 ENCOUNTER — Ambulatory Visit (HOSPITAL_BASED_OUTPATIENT_CLINIC_OR_DEPARTMENT_OTHER): Payer: Medicare Other | Admitting: Certified Registered Nurse Anesthetist

## 2021-12-29 ENCOUNTER — Ambulatory Visit (HOSPITAL_BASED_OUTPATIENT_CLINIC_OR_DEPARTMENT_OTHER)
Admission: RE | Admit: 2021-12-29 | Discharge: 2021-12-29 | Disposition: A | Discharge status: Home / Self Care | Payer: Medicare Other | Attending: Urology | Admitting: Urology

## 2021-12-29 DIAGNOSIS — C61 Malignant neoplasm of prostate: Secondary | ICD-10-CM | POA: Insufficient documentation

## 2021-12-29 DIAGNOSIS — Z01818 Encounter for other preprocedural examination: Secondary | ICD-10-CM

## 2021-12-29 DIAGNOSIS — M1990 Unspecified osteoarthritis, unspecified site: Secondary | ICD-10-CM | POA: Diagnosis not present

## 2021-12-29 DIAGNOSIS — I10 Essential (primary) hypertension: Secondary | ICD-10-CM | POA: Diagnosis not present

## 2021-12-29 DIAGNOSIS — Z79899 Other long term (current) drug therapy: Secondary | ICD-10-CM | POA: Diagnosis not present

## 2021-12-29 HISTORY — PX: SPACE OAR INSTILLATION: SHX6769

## 2021-12-29 HISTORY — DX: Mixed hyperlipidemia: E78.2

## 2021-12-29 HISTORY — PX: GOLD SEED IMPLANT: SHX6343

## 2021-12-29 HISTORY — DX: Presence of dental prosthetic device (complete) (partial): Z97.2

## 2021-12-29 HISTORY — DX: Prediabetes: R73.03

## 2021-12-29 HISTORY — DX: Benign prostatic hyperplasia with lower urinary tract symptoms: N13.8

## 2021-12-29 HISTORY — DX: Unspecified osteoarthritis, unspecified site: M19.90

## 2021-12-29 HISTORY — DX: Personal history of other specified conditions: Z87.898

## 2021-12-29 LAB — POCT I-STAT, CHEM 8
BUN: 21 mg/dL (ref 8–23)
Calcium, Ion: 1.27 mmol/L (ref 1.15–1.40)
Chloride: 103 mmol/L (ref 98–111)
Creatinine, Ser: 0.8 mg/dL (ref 0.61–1.24)
Glucose, Bld: 95 mg/dL (ref 70–99)
HCT: 39 % (ref 39.0–52.0)
Hemoglobin: 13.3 g/dL (ref 13.0–17.0)
Potassium: 3.7 mmol/L (ref 3.5–5.1)
Sodium: 140 mmol/L (ref 135–145)
TCO2: 26 mmol/L (ref 22–32)

## 2021-12-29 SURGERY — INSERTION, GOLD SEEDS
Anesthesia: Monitor Anesthesia Care | Site: Prostate

## 2021-12-29 MED ORDER — LIDOCAINE HCL (CARDIAC) PF 100 MG/5ML IV SOSY
PREFILLED_SYRINGE | INTRAVENOUS | Status: DC | PRN
  Administered 2021-12-29: 40 mg via INTRAVENOUS

## 2021-12-29 MED ORDER — CEFAZOLIN SODIUM-DEXTROSE 2-4 GM/100ML-% IV SOLN
2.0000 g | INTRAVENOUS | Status: AC
  Administered 2021-12-29: 2 g via INTRAVENOUS

## 2021-12-29 MED ORDER — ACETAMINOPHEN 500 MG PO TABS
1000.0000 mg | ORAL_TABLET | Freq: Once | ORAL | Status: AC
  Administered 2021-12-29: 1000 mg via ORAL

## 2021-12-29 MED ORDER — FENTANYL CITRATE (PF) 100 MCG/2ML IJ SOLN
INTRAMUSCULAR | Status: AC
  Filled 2021-12-29: qty 2

## 2021-12-29 MED ORDER — OXYCODONE HCL 5 MG PO TABS: 5.0000 mg | ORAL_TABLET | Freq: Once | ORAL | Status: DC | PRN

## 2021-12-29 MED ORDER — CEFAZOLIN SODIUM-DEXTROSE 2-4 GM/100ML-% IV SOLN
INTRAVENOUS | Status: AC
  Filled 2021-12-29: qty 100

## 2021-12-29 MED ORDER — FENTANYL CITRATE (PF) 100 MCG/2ML IJ SOLN: 25.0000 ug | INTRAMUSCULAR | Status: DC | PRN

## 2021-12-29 MED ORDER — FLEET ENEMA 7-19 GM/118ML RE ENEM: 1.0000 | ENEMA | Freq: Once | RECTAL | Status: DC

## 2021-12-29 MED ORDER — ACETAMINOPHEN 500 MG PO TABS
ORAL_TABLET | ORAL | Status: AC
  Filled 2021-12-29: qty 2

## 2021-12-29 MED ORDER — ONDANSETRON HCL 4 MG/2ML IJ SOLN
INTRAMUSCULAR | Status: DC | PRN
  Administered 2021-12-29: 4 mg via INTRAVENOUS

## 2021-12-29 MED ORDER — SODIUM CHLORIDE (PF) 0.9 % IJ SOLN
INTRAMUSCULAR | Status: DC | PRN
  Administered 2021-12-29: 10 mL

## 2021-12-29 MED ORDER — LACTATED RINGERS IV SOLN: INTRAVENOUS | Status: DC

## 2021-12-29 MED ORDER — PROPOFOL 500 MG/50ML IV EMUL
INTRAVENOUS | Status: DC | PRN
  Administered 2021-12-29: 100 ug/kg/min via INTRAVENOUS

## 2021-12-29 MED ORDER — BUPIVACAINE HCL 0.25 % IJ SOLN
INTRAMUSCULAR | Status: DC | PRN
  Administered 2021-12-29: 10 mL

## 2021-12-29 MED ORDER — OXYCODONE HCL 5 MG/5ML PO SOLN: 5.0000 mg | Freq: Once | ORAL | Status: DC | PRN

## 2021-12-29 MED ORDER — PROPOFOL 10 MG/ML IV BOLUS
INTRAVENOUS | Status: DC | PRN
  Administered 2021-12-29: 30 mg via INTRAVENOUS

## 2021-12-29 MED ORDER — FENTANYL CITRATE (PF) 100 MCG/2ML IJ SOLN
INTRAMUSCULAR | Status: DC | PRN
  Administered 2021-12-29 (×2): 50 ug via INTRAVENOUS

## 2021-12-29 SURGICAL SUPPLY — 26 items
BLADE CLIPPER SENSICLIP SURGIC (BLADE) ×3 IMPLANT
CNTNR URN SCR LID CUP LEK RST (MISCELLANEOUS) ×2 IMPLANT
CONT SPEC 4OZ STRL OR WHT (MISCELLANEOUS) ×3
COVER BACK TABLE 60X90IN (DRAPES) ×3 IMPLANT
DRSG TEGADERM 4X4.75 (GAUZE/BANDAGES/DRESSINGS) ×3 IMPLANT
DRSG TEGADERM 8X12 (GAUZE/BANDAGES/DRESSINGS) ×3 IMPLANT
GAUZE SPONGE 4X4 12PLY STRL (GAUZE/BANDAGES/DRESSINGS) ×3 IMPLANT
GAUZE SPONGE 4X4 12PLY STRL LF (GAUZE/BANDAGES/DRESSINGS) ×1 IMPLANT
GLOVE BIO SURGEON STRL SZ7.5 (GLOVE) ×3 IMPLANT
GLOVE ECLIPSE 8.0 STRL XLNG CF (GLOVE) ×3 IMPLANT
GLOVE SURG ORTHO 8.5 STRL (GLOVE) ×3 IMPLANT
IMPL SPACEOAR VUE SYSTEM (Spacer) ×2 IMPLANT
IMPLANT SPACEOAR VUE SYSTEM (Spacer) ×3 IMPLANT
KIT TURNOVER CYSTO (KITS) ×3 IMPLANT
MARKER GOLD PRELOAD 1.2X3 (Urological Implant) ×2 IMPLANT
MARKER SKIN DUAL TIP RULER LAB (MISCELLANEOUS) ×3 IMPLANT
NDL SPNL 22GX3.5 QUINCKE BK (NEEDLE) IMPLANT
NEEDLE SPNL 22GX3.5 QUINCKE BK (NEEDLE) ×3 IMPLANT
SEED GOLD PRELOAD 1.2X3 (Urological Implant) ×3 IMPLANT
SHEATH ULTRASOUND LF (SHEATH) IMPLANT
SHEATH ULTRASOUND LTX NONSTRL (SHEATH) IMPLANT
SURGILUBE 2OZ TUBE FLIPTOP (MISCELLANEOUS) ×3 IMPLANT
SYR 10ML LL (SYRINGE) ×1 IMPLANT
SYR CONTROL 10ML LL (SYRINGE) ×3 IMPLANT
TOWEL OR 17X26 10 PK STRL BLUE (TOWEL DISPOSABLE) ×3 IMPLANT
UNDERPAD 30X36 HEAVY ABSORB (UNDERPADS AND DIAPERS) ×3 IMPLANT

## 2021-12-29 NOTE — Discharge Instructions (Signed)
No acetaminophen/Tylenol until after 2:28pm today if needed for pain.  ? ? ? ?Post Anesthesia Home Care Instructions ? ?Activity: ?Get plenty of rest for the remainder of the day. A responsible individual must stay with you for 24 hours following the procedure.  ?For the next 24 hours, DO NOT: ?-Drive a car ?-Paediatric nurse ?-Drink alcoholic beverages ?-Take any medication unless instructed by your physician ?-Make any legal decisions or sign important papers. ? ?Meals: ?Start with liquid foods such as gelatin or soup. Progress to regular foods as tolerated. Avoid greasy, spicy, heavy foods. If nausea and/or vomiting occur, drink only clear liquids until the nausea and/or vomiting subsides. Call your physician if vomiting continues. ? ?Special Instructions/Symptoms: ?Your throat may feel dry or sore from the anesthesia or the breathing tube placed in your throat during surgery. If this causes discomfort, gargle with warm salt water. The discomfort should disappear within 24 hours. ? ? ?    ?

## 2021-12-29 NOTE — Transfer of Care (Signed)
Immediate Anesthesia Transfer of Care Note ? ?Patient: John Stone ? ?Procedure(s) Performed: GOLD SEED IMPLANT (Prostate) ?SPACE OAR INSTILLATION (Perineum) ? ?Patient Location: PACU ? ?Anesthesia Type:MAC ? ?Level of Consciousness: awake, alert  and oriented ? ?Airway & Oxygen Therapy: Patient Spontanous Breathing ? ?Post-op Assessment: Report given to RN and Post -op Vital signs reviewed and stable ? ?Post vital signs: Reviewed and stable ? ?Last Vitals:  ?Vitals Value Taken Time  ?BP 109/76 12/29/21 0942  ?Temp    ?Pulse 54 12/29/21 0945  ?Resp 18 12/29/21 0945  ?SpO2 97 % 12/29/21 0945  ?Vitals shown include unvalidated device data. ? ?Last Pain:  ?Vitals:  ? 12/29/21 0801  ?TempSrc: Oral  ?PainSc: 0-No pain  ?   ? ?Patients Stated Pain Goal: 4 (12/29/21 0801) ? ?Complications: No notable events documented. ?

## 2021-12-29 NOTE — H&P (Signed)
H&P ? ?Chief Complaint: Prostate cancer ? ?History of Present Illness: 71 year old male with prostate cancer presents for marker placement and SpaceOAR. ? ?Past Medical History:  ?Diagnosis Date  ? Arthritis   ? BPH with obstruction/lower urinary tract symptoms   ? History of chronic prostatitis 2005  ? post op prostate bx's  ? History of urinary retention   ? Hyperlipemia, mixed   ? Hypertension   ? Metastatic castration-sensitive adenocarcinoma of prostate [facility]) 08/2021  ? urologist--- dr newsome/  oncologist--- dr Alen Blew;  dx 01/ 2023,  gleason 8, PSA 60.7,  w/ pelvic adenopathy include left para-aortic lymph node  ? Pre-diabetes   ? Wears dentures   ? upper  ? ?Past Surgical History:  ?Procedure Laterality Date  ? PROSTATE SURGERY  11/2003  ? TUNA procedure by dr Durwin Reges  ? PROSTATECTOMY  04/22/2004  ? '@[facility]'$  by dr Nichola Sizer;  OPEN SIMPLE PROSTATECTOMY FOR BPH WITH CHRONIC URINARY RETENTION  ? ? ?Home Medications:  ?Medications Prior to Admission  ?Medication Sig Dispense Refill Last Dose  ? amLODipine (NORVASC) 5 MG tablet TAKE 1 TABLET(5 MG) BY MOUTH DAILY (Patient taking differently: Take 5 mg by mouth daily.) 90 tablet 3 12/29/2021  ? lisinopril-hydrochlorothiazide (ZESTORETIC) 20-25 MG tablet Take 1 tablet by mouth daily. (Patient taking differently: Take 1 tablet by mouth daily.) 90 tablet 3 12/28/2021  ? silodosin (RAPAFLO) 8 MG CAPS capsule Take 8 mg by mouth daily with breakfast.   12/28/2021  ? simvastatin (ZOCOR) 20 MG tablet Take 1 tablet (20 mg total) by mouth daily. (Patient taking differently: Take 20 mg by mouth daily.) 90 tablet 3 12/29/2021  ? ?Allergies: No Known Allergies ? ?Family History  ?Problem Relation Age of Onset  ? Hypertension Mother   ?     parent  ? Hypertension Maternal Grandfather   ?     grandparent   ? Colon cancer Neg Hx   ? Colon polyps Neg Hx   ? Esophageal cancer Neg Hx   ? Rectal cancer Neg Hx   ? Stomach cancer Neg Hx   ? ?Social History:  reports that he has never smoked.  He has never used smokeless tobacco. He reports that he does not drink alcohol and does not use drugs. ? ?ROS: ?A complete review of systems was performed.  All systems are negative except for pertinent findings as noted. ?ROS ? ? ?Physical Exam:  ?Vital signs in last 24 hours: ?Temp:  [98.7 ?F (37.1 ?C)] 98.7 ?F (37.1 ?C) (05/17 0801) ?Pulse Rate:  [61] 61 (05/17 0801) ?Resp:  [18] 18 (05/17 0801) ?BP: (146)/(89) 146/89 (05/17 0801) ?SpO2:  [100 %] 100 % (05/17 0801) ?Weight:  [82.6 kg] 82.6 kg (05/17 0801) ?General:  Alert and oriented, No acute distress ?HEENT: Normocephalic, atraumatic ?Neck: No JVD or lymphadenopathy ?Cardiovascular: Regular rate and rhythm ?Lungs: Regular rate and effort ?Abdomen: Soft, nontender, nondistended, no abdominal masses ?Back: No CVA tenderness ?Extremities: No edema ?Neurologic: Grossly intact ? ?Laboratory Data:  ?No results found for this or any previous visit (from the past 24 hour(s)). ?No results found for this or any previous visit (from the past 240 hour(s)). ?Creatinine: ?No results for input(s): CREATININE in the last 168 hours. ? ?Impression/Assessment:  ?Prostate cancer ? ?Plan:  ?Proceed with marker placement and SpaceOAR.  Risk benefits discussed including but not limited to bleeding, infection, injury to surrounding structures, rectal perforation, severe rectal pain and discomfort, pelvic pain and discomfort, development of rectal ulceration among other imponderables. ? ?Cornelia Copa  Clydia Llano, III ?12/29/2021, 8:24 AM  ? ?

## 2021-12-29 NOTE — Anesthesia Preprocedure Evaluation (Addendum)
Anesthesia Evaluation  ?Patient identified by MRN, date of birth, ID band ?Patient awake ? ? ? ?Reviewed: ?Allergy & Precautions, NPO status , Patient's Chart, lab work & pertinent test results ? ?History of Anesthesia Complications ?Negative for: history of anesthetic complications ? ?Airway ?Mallampati: II ? ?TM Distance: >3 FB ?Neck ROM: Full ? ? ? Dental ? ?(+) Missing,  ?  ?Pulmonary ?neg pulmonary ROS,  ?  ?Pulmonary exam normal ? ? ? ? ? ? ? Cardiovascular ?hypertension, Pt. on medications ?Normal cardiovascular exam ? ? ?  ?Neuro/Psych ?negative neurological ROS ? negative psych ROS  ? GI/Hepatic ?negative GI ROS, Neg liver ROS,   ?Endo/Other  ?negative endocrine ROS ? Renal/GU ?negative Renal ROS  ? ?  ?Musculoskeletal ? ?(+) Arthritis ,  ? Abdominal ?  ?Peds ? Hematology ?negative hematology ROS ?(+)   ?Anesthesia Other Findings ?Prostate ca ? Reproductive/Obstetrics ? ?  ? ? ? ? ? ? ? ? ? ? ? ? ? ?  ?  ? ? ? ? ? ? ? ?Anesthesia Physical ?Anesthesia Plan ? ?ASA: 2 ? ?Anesthesia Plan: MAC  ? ?Post-op Pain Management: Tylenol PO (pre-op)*  ? ?Induction:  ? ?PONV Risk Score and Plan: Treatment may vary due to age or medical condition and Propofol infusion ? ?Airway Management Planned: Natural Airway and Simple Face Mask ? ?Additional Equipment: None ? ?Intra-op Plan:  ? ?Post-operative Plan:  ? ?Informed Consent: I have reviewed the patients History and Physical, chart, labs and discussed the procedure including the risks, benefits and alternatives for the proposed anesthesia with the patient or authorized representative who has indicated his/her understanding and acceptance.  ? ? ? ? ? ?Plan Discussed with: CRNA ? ?Anesthesia Plan Comments:   ? ? ? ? ? ?Anesthesia Quick Evaluation ? ?

## 2021-12-29 NOTE — Op Note (Addendum)
Preoperative diagnosis: Clinically localized adenocarcinoma of the prostate  ? ?Postoperative diagnosis: Clinically localized adenocarcinoma of the prostate ? ?Procedure: 1) Placement of fiducial markers into prostate ?                   2) Insertion of SpaceOAR hydrogel  ? ?Surgeon: Link Snuffer, M.D. ? ?Anesthesia: MAC sedation ? ?EBL: Minimal ? ?Complications: None ? ?Indication: John Stone is a 71 y.o. gentleman with clinically localized prostate cancer. After discussing management options for treatment, he elected to proceed with radiotherapy. He presents today for the above procedures. The potential risks, complications, alternative options, and expected recovery course have been discussed in detail with the patient and he has provided informed consent to proceed. ? ?Description of procedure: The patient was administered preoperative antibiotics, placed in the dorsal lithotomy position, and prepped and draped in the usual sterile fashion. Next, transrectal ultrasonography was utilized to visualize the prostate.  Quarter percent Marcaine was instilled into the perineum.  Three gold fiducial markers were then placed into the prostate via transperineal needles under ultrasound guidance at the right apex, right base, and left mid gland under direct ultrasound guidance. A site in the midline was then selected on the perineum for placement of an 18 g needle with saline. The needle was advanced above the rectum and below Denonvillier's fascia to the mid gland and confirmed to be in the midline on transverse imaging. One cc of saline was injected confirming appropriate expansion of this space. A total of 5 cc of saline was then injected to open the space further bilaterally. The saline syringe was then removed and the SpaceOAR hydrogel was injected with good distribution bilaterally. He tolerated the procedure well and without complications. He was given a voiding trial prior to discharge from the PACU. ? ?

## 2021-12-29 NOTE — Anesthesia Postprocedure Evaluation (Signed)
Anesthesia Post Note ? ?Patient: John Stone ? ?Procedure(s) Performed: GOLD SEED IMPLANT (Prostate) ?SPACE OAR INSTILLATION (Perineum) ? ?  ? ?Patient location during evaluation: PACU ?Anesthesia Type: MAC ?Level of consciousness: awake and alert ?Pain management: pain level controlled ?Vital Signs Assessment: post-procedure vital signs reviewed and stable ?Respiratory status: spontaneous breathing, nonlabored ventilation and respiratory function stable ?Cardiovascular status: blood pressure returned to baseline ?Postop Assessment: no apparent nausea or vomiting ?Anesthetic complications: no ? ? ?No notable events documented. ? ?Last Vitals:  ?Vitals:  ? 12/29/21 0945 12/29/21 1000  ?BP: 104/70 100/64  ?Pulse: (!) 54 (!) 45  ?Resp: 18 19  ?Temp:    ?SpO2: 97% 99%  ?  ?Last Pain:  ?Vitals:  ? 12/29/21 1000  ?TempSrc:   ?PainSc: 0-No pain  ? ? ?  ?  ?  ?  ?  ?  ? ?Marthenia Rolling ? ? ? ? ?

## 2021-12-30 ENCOUNTER — Telehealth: Payer: Self-pay | Admitting: *Deleted

## 2021-12-30 ENCOUNTER — Encounter (HOSPITAL_BASED_OUTPATIENT_CLINIC_OR_DEPARTMENT_OTHER): Payer: Self-pay | Admitting: Urology

## 2021-12-30 NOTE — Telephone Encounter (Signed)
CALLED PATIENT TO REMIND OF SIM APPT. FOR 12-31-21- ARRIVAL TIME- 8:45 AM @ [facility], INFORMED PATIENT TO ARRIVE WITH A FULL BLADDER AND AN EMPTY BOWEL, SPOKE WITH PATIENT AND HE IS AWARE OF THIS APPT. AND THE INSTRUCTIONS

## 2021-12-31 ENCOUNTER — Ambulatory Visit: Admission: RE | Admit: 2021-12-31 | Payer: Medicare Other | Source: Ambulatory Visit | Admitting: Radiation Oncology

## 2021-12-31 ENCOUNTER — Other Ambulatory Visit: Payer: Self-pay

## 2021-12-31 DIAGNOSIS — R338 Other retention of urine: Secondary | ICD-10-CM | POA: Diagnosis not present

## 2022-01-04 ENCOUNTER — Telehealth: Payer: Self-pay | Admitting: *Deleted

## 2022-01-04 NOTE — Telephone Encounter (Signed)
CALLED PATIENT TO REMIND OF SIM APPT. FOR 01-07-22- ARRIVAL TIME- 9:45 AM @ [facility], INFORMED PATIENT TO ARRIVE WITH A FULL BLADDER AND AN EMPTY BOWEL

## 2022-01-05 DIAGNOSIS — R338 Other retention of urine: Secondary | ICD-10-CM | POA: Diagnosis not present

## 2022-01-06 NOTE — Progress Notes (Signed)
  Radiation Oncology         [phone] ________________________________  Name: John Stone MRN: [id number]  Date: 01/07/2022  DOB: 06-Feb-1951  SIMULATION AND TREATMENT PLANNING NOTE    ICD-10-CM   1. Prostate cancer [facility])  C61       DIAGNOSIS:  71 y.o. gentleman with locally advanced adenocarcinoma of the prostate involving pelvic and periaortic lymph nodes, with a Gleason's score of 4+4 and a PSA of 60.7.  NARRATIVE:  The patient was brought to the [facility].  Identity was confirmed.  All relevant records and images related to the planned course of therapy were reviewed.  The patient freely provided informed written consent to proceed with treatment after reviewing the details related to the planned course of therapy. The consent form was witnessed and verified by the simulation staff.  Then, the patient was set-up in a stable reproducible supine position for radiation therapy.  A vacuum lock pillow device was custom fabricated to position his legs in a reproducible immobilized position.  Then, I performed a urethrogram under sterile conditions to identify the prostatic bed.  CT images were obtained.  Surface markings were placed.  The CT images were loaded into the planning software.  Then the prostate bed target, pelvic lymph node target and avoidance structures including the rectum, bladder, bowel and hips were contoured.  Treatment planning then occurred.  The radiation prescription was entered and confirmed.  A total of one complex treatment devices were fabricated. I have requested : Intensity Modulated Radiotherapy (IMRT) is medically necessary for this case for the following reason:  Rectal sparing.Marland Kitchen  PLAN:  The patient will receive 45 Gy in 25 fractions of 1.8 Gy, followed by a boost to the prostate and PET positive nodes to a total dose of 75 Gy with 15 additional fractions of 2 Gy.   ________________________________  Sheral Apley Tammi Klippel, M.D.

## 2022-01-07 ENCOUNTER — Ambulatory Visit
Admission: RE | Admit: 2022-01-07 | Discharge: 2022-01-07 | Disposition: A | Discharge status: Home / Self Care | Payer: Medicare Other | Source: Ambulatory Visit | Attending: Radiation Oncology | Admitting: Radiation Oncology

## 2022-01-07 DIAGNOSIS — R3914 Feeling of incomplete bladder emptying: Secondary | ICD-10-CM | POA: Diagnosis not present

## 2022-01-07 DIAGNOSIS — Z191 Hormone sensitive malignancy status: Secondary | ICD-10-CM | POA: Diagnosis not present

## 2022-01-07 DIAGNOSIS — C61 Malignant neoplasm of prostate: Secondary | ICD-10-CM | POA: Insufficient documentation

## 2022-01-12 DIAGNOSIS — Z191 Hormone sensitive malignancy status: Secondary | ICD-10-CM | POA: Diagnosis not present

## 2022-01-14 DIAGNOSIS — Z191 Hormone sensitive malignancy status: Secondary | ICD-10-CM | POA: Diagnosis not present

## 2022-01-14 DIAGNOSIS — C61 Malignant neoplasm of prostate: Secondary | ICD-10-CM | POA: Insufficient documentation

## 2022-01-17 ENCOUNTER — Ambulatory Visit: Payer: Medicare Other

## 2022-01-18 ENCOUNTER — Ambulatory Visit: Payer: Medicare Other

## 2022-01-19 ENCOUNTER — Ambulatory Visit: Payer: Medicare Other

## 2022-01-19 ENCOUNTER — Other Ambulatory Visit: Payer: Self-pay

## 2022-01-19 ENCOUNTER — Ambulatory Visit
Admission: RE | Admit: 2022-01-19 | Discharge: 2022-01-19 | Disposition: A | Discharge status: Home / Self Care | Payer: Medicare Other | Source: Ambulatory Visit | Attending: Radiation Oncology | Admitting: Radiation Oncology

## 2022-01-19 DIAGNOSIS — Z510 Encounter for antineoplastic radiation therapy: Secondary | ICD-10-CM | POA: Diagnosis not present

## 2022-01-19 DIAGNOSIS — Z191 Hormone sensitive malignancy status: Secondary | ICD-10-CM | POA: Diagnosis not present

## 2022-01-19 DIAGNOSIS — C61 Malignant neoplasm of prostate: Secondary | ICD-10-CM | POA: Diagnosis not present

## 2022-01-19 LAB — RAD ONC ARIA SESSION SUMMARY
Course Elapsed Days: 0
Plan Fractions Treated to Date: 1
Plan Prescribed Dose Per Fraction: 1.8 Gy
Plan Total Fractions Prescribed: 25
Plan Total Prescribed Dose: 45 Gy
Reference Point Dosage Given to Date: 1.8 Gy
Reference Point Session Dosage Given: 1.8 Gy
Session Number: 1

## 2022-01-20 ENCOUNTER — Other Ambulatory Visit: Payer: Self-pay

## 2022-01-20 ENCOUNTER — Ambulatory Visit
Admission: RE | Admit: 2022-01-20 | Discharge: 2022-01-20 | Disposition: A | Discharge status: Home / Self Care | Payer: Medicare Other | Source: Ambulatory Visit | Attending: Radiation Oncology | Admitting: Radiation Oncology

## 2022-01-20 ENCOUNTER — Ambulatory Visit: Payer: Medicare Other

## 2022-01-20 DIAGNOSIS — C61 Malignant neoplasm of prostate: Secondary | ICD-10-CM | POA: Diagnosis not present

## 2022-01-20 DIAGNOSIS — Z191 Hormone sensitive malignancy status: Secondary | ICD-10-CM | POA: Diagnosis not present

## 2022-01-20 DIAGNOSIS — Z510 Encounter for antineoplastic radiation therapy: Secondary | ICD-10-CM | POA: Diagnosis not present

## 2022-01-20 LAB — RAD ONC ARIA SESSION SUMMARY
Course Elapsed Days: 1
Plan Fractions Treated to Date: 2
Plan Prescribed Dose Per Fraction: 1.8 Gy
Plan Total Fractions Prescribed: 25
Plan Total Prescribed Dose: 45 Gy
Reference Point Dosage Given to Date: 3.6 Gy
Reference Point Session Dosage Given: 1.8 Gy
Session Number: 2

## 2022-01-21 ENCOUNTER — Ambulatory Visit
Admission: RE | Admit: 2022-01-21 | Discharge: 2022-01-21 | Disposition: A | Discharge status: Home / Self Care | Payer: Medicare Other | Source: Ambulatory Visit | Attending: Radiation Oncology | Admitting: Radiation Oncology

## 2022-01-21 ENCOUNTER — Other Ambulatory Visit: Payer: Self-pay

## 2022-01-21 DIAGNOSIS — C61 Malignant neoplasm of prostate: Secondary | ICD-10-CM | POA: Diagnosis not present

## 2022-01-21 DIAGNOSIS — Z510 Encounter for antineoplastic radiation therapy: Secondary | ICD-10-CM | POA: Diagnosis not present

## 2022-01-21 DIAGNOSIS — Z191 Hormone sensitive malignancy status: Secondary | ICD-10-CM | POA: Diagnosis not present

## 2022-01-21 LAB — RAD ONC ARIA SESSION SUMMARY
Course Elapsed Days: 2
Plan Fractions Treated to Date: 3
Plan Prescribed Dose Per Fraction: 1.8 Gy
Plan Total Fractions Prescribed: 25
Plan Total Prescribed Dose: 45 Gy
Reference Point Dosage Given to Date: 5.4 Gy
Reference Point Session Dosage Given: 1.8 Gy
Session Number: 3

## 2022-01-21 NOTE — Progress Notes (Signed)
Pt here for patient teaching.  Pt given Radiation and You booklet and skin care instructions.  Reviewed areas of pertinence such as diarrhea, fatigue, skin changes, and urinary and bladder changes . Pt able to give teach back of to pat skin, use unscented/gentle soap, have Imodium on hand, drink plenty of water, and sitz bath,avoid applying anything to skin within 4 hours of treatment. Pt demonstrated understanding and verbalizes understanding of information given and will contact nursing with any questions or concerns.     [email or web address]

## 2022-01-24 ENCOUNTER — Ambulatory Visit
Admission: RE | Admit: 2022-01-24 | Discharge: 2022-01-24 | Disposition: A | Discharge status: Home / Self Care | Payer: Medicare Other | Source: Ambulatory Visit | Attending: Radiation Oncology | Admitting: Radiation Oncology

## 2022-01-24 ENCOUNTER — Other Ambulatory Visit: Payer: Self-pay

## 2022-01-24 DIAGNOSIS — Z191 Hormone sensitive malignancy status: Secondary | ICD-10-CM | POA: Diagnosis not present

## 2022-01-24 DIAGNOSIS — Z510 Encounter for antineoplastic radiation therapy: Secondary | ICD-10-CM | POA: Diagnosis not present

## 2022-01-24 DIAGNOSIS — C61 Malignant neoplasm of prostate: Secondary | ICD-10-CM | POA: Diagnosis not present

## 2022-01-24 LAB — RAD ONC ARIA SESSION SUMMARY
Course Elapsed Days: 5
Plan Fractions Treated to Date: 4
Plan Prescribed Dose Per Fraction: 1.8 Gy
Plan Total Fractions Prescribed: 25
Plan Total Prescribed Dose: 45 Gy
Reference Point Dosage Given to Date: 7.2 Gy
Reference Point Session Dosage Given: 1.8 Gy
Session Number: 4

## 2022-01-25 ENCOUNTER — Ambulatory Visit
Admission: RE | Admit: 2022-01-25 | Discharge: 2022-01-25 | Disposition: A | Discharge status: Home / Self Care | Payer: Medicare Other | Source: Ambulatory Visit | Attending: Radiation Oncology | Admitting: Radiation Oncology

## 2022-01-25 ENCOUNTER — Other Ambulatory Visit: Payer: Self-pay

## 2022-01-25 DIAGNOSIS — Z510 Encounter for antineoplastic radiation therapy: Secondary | ICD-10-CM | POA: Diagnosis not present

## 2022-01-25 DIAGNOSIS — Z191 Hormone sensitive malignancy status: Secondary | ICD-10-CM | POA: Diagnosis not present

## 2022-01-25 DIAGNOSIS — C61 Malignant neoplasm of prostate: Secondary | ICD-10-CM | POA: Diagnosis not present

## 2022-01-25 LAB — RAD ONC ARIA SESSION SUMMARY
Course Elapsed Days: 6
Plan Fractions Treated to Date: 5
Plan Prescribed Dose Per Fraction: 1.8 Gy
Plan Total Fractions Prescribed: 25
Plan Total Prescribed Dose: 45 Gy
Reference Point Dosage Given to Date: 9 Gy
Reference Point Session Dosage Given: 1.8 Gy
Session Number: 5

## 2022-01-26 ENCOUNTER — Other Ambulatory Visit: Payer: Self-pay

## 2022-01-26 ENCOUNTER — Ambulatory Visit
Admission: RE | Admit: 2022-01-26 | Discharge: 2022-01-26 | Disposition: A | Discharge status: Home / Self Care | Payer: Medicare Other | Source: Ambulatory Visit | Attending: Radiation Oncology | Admitting: Radiation Oncology

## 2022-01-26 DIAGNOSIS — Z191 Hormone sensitive malignancy status: Secondary | ICD-10-CM | POA: Diagnosis not present

## 2022-01-26 DIAGNOSIS — C61 Malignant neoplasm of prostate: Secondary | ICD-10-CM | POA: Diagnosis not present

## 2022-01-26 DIAGNOSIS — Z510 Encounter for antineoplastic radiation therapy: Secondary | ICD-10-CM | POA: Diagnosis not present

## 2022-01-26 LAB — RAD ONC ARIA SESSION SUMMARY
Course Elapsed Days: 7
Plan Fractions Treated to Date: 6
Plan Prescribed Dose Per Fraction: 1.8 Gy
Plan Total Fractions Prescribed: 25
Plan Total Prescribed Dose: 45 Gy
Reference Point Dosage Given to Date: 10.8 Gy
Reference Point Session Dosage Given: 1.8 Gy
Session Number: 6

## 2022-01-27 ENCOUNTER — Ambulatory Visit
Admission: RE | Admit: 2022-01-27 | Discharge: 2022-01-27 | Disposition: A | Discharge status: Home / Self Care | Payer: Medicare Other | Source: Ambulatory Visit | Attending: Radiation Oncology | Admitting: Radiation Oncology

## 2022-01-27 ENCOUNTER — Other Ambulatory Visit: Payer: Self-pay

## 2022-01-27 DIAGNOSIS — Z191 Hormone sensitive malignancy status: Secondary | ICD-10-CM | POA: Diagnosis not present

## 2022-01-27 DIAGNOSIS — Z510 Encounter for antineoplastic radiation therapy: Secondary | ICD-10-CM | POA: Diagnosis not present

## 2022-01-27 DIAGNOSIS — C61 Malignant neoplasm of prostate: Secondary | ICD-10-CM | POA: Diagnosis not present

## 2022-01-27 LAB — RAD ONC ARIA SESSION SUMMARY
Course Elapsed Days: 8
Plan Fractions Treated to Date: 7
Plan Prescribed Dose Per Fraction: 1.8 Gy
Plan Total Fractions Prescribed: 25
Plan Total Prescribed Dose: 45 Gy
Reference Point Dosage Given to Date: 12.6 Gy
Reference Point Session Dosage Given: 1.8 Gy
Session Number: 7

## 2022-01-28 ENCOUNTER — Other Ambulatory Visit: Payer: Self-pay

## 2022-01-28 ENCOUNTER — Ambulatory Visit
Admission: RE | Admit: 2022-01-28 | Discharge: 2022-01-28 | Disposition: A | Discharge status: Home / Self Care | Payer: Medicare Other | Source: Ambulatory Visit | Attending: Radiation Oncology | Admitting: Radiation Oncology

## 2022-01-28 DIAGNOSIS — Z191 Hormone sensitive malignancy status: Secondary | ICD-10-CM | POA: Diagnosis not present

## 2022-01-28 DIAGNOSIS — C61 Malignant neoplasm of prostate: Secondary | ICD-10-CM | POA: Diagnosis not present

## 2022-01-28 DIAGNOSIS — Z510 Encounter for antineoplastic radiation therapy: Secondary | ICD-10-CM | POA: Diagnosis not present

## 2022-01-28 LAB — RAD ONC ARIA SESSION SUMMARY
Course Elapsed Days: 9
Plan Fractions Treated to Date: 8
Plan Prescribed Dose Per Fraction: 1.8 Gy
Plan Total Fractions Prescribed: 25
Plan Total Prescribed Dose: 45 Gy
Reference Point Dosage Given to Date: 14.4 Gy
Reference Point Session Dosage Given: 1.8 Gy
Session Number: 8

## 2022-01-31 ENCOUNTER — Other Ambulatory Visit: Payer: Self-pay

## 2022-01-31 ENCOUNTER — Ambulatory Visit
Admission: RE | Admit: 2022-01-31 | Discharge: 2022-01-31 | Disposition: A | Discharge status: Home / Self Care | Payer: Medicare Other | Source: Ambulatory Visit | Attending: Radiation Oncology | Admitting: Radiation Oncology

## 2022-01-31 DIAGNOSIS — Z191 Hormone sensitive malignancy status: Secondary | ICD-10-CM | POA: Diagnosis not present

## 2022-01-31 DIAGNOSIS — Z510 Encounter for antineoplastic radiation therapy: Secondary | ICD-10-CM | POA: Diagnosis not present

## 2022-01-31 DIAGNOSIS — C61 Malignant neoplasm of prostate: Secondary | ICD-10-CM | POA: Diagnosis not present

## 2022-01-31 LAB — RAD ONC ARIA SESSION SUMMARY
Course Elapsed Days: 12
Plan Fractions Treated to Date: 9
Plan Prescribed Dose Per Fraction: 1.8 Gy
Plan Total Fractions Prescribed: 25
Plan Total Prescribed Dose: 45 Gy
Reference Point Dosage Given to Date: 16.2 Gy
Reference Point Session Dosage Given: 1.8 Gy
Session Number: 9

## 2022-02-01 ENCOUNTER — Other Ambulatory Visit: Payer: Self-pay

## 2022-02-01 ENCOUNTER — Ambulatory Visit
Admission: RE | Admit: 2022-02-01 | Discharge: 2022-02-01 | Disposition: A | Discharge status: Home / Self Care | Payer: Medicare Other | Source: Ambulatory Visit | Attending: Radiation Oncology | Admitting: Radiation Oncology

## 2022-02-01 DIAGNOSIS — Z510 Encounter for antineoplastic radiation therapy: Secondary | ICD-10-CM | POA: Diagnosis not present

## 2022-02-01 DIAGNOSIS — Z191 Hormone sensitive malignancy status: Secondary | ICD-10-CM | POA: Diagnosis not present

## 2022-02-01 DIAGNOSIS — C61 Malignant neoplasm of prostate: Secondary | ICD-10-CM | POA: Diagnosis not present

## 2022-02-01 LAB — RAD ONC ARIA SESSION SUMMARY
Course Elapsed Days: 13
Plan Fractions Treated to Date: 10
Plan Prescribed Dose Per Fraction: 1.8 Gy
Plan Total Fractions Prescribed: 25
Plan Total Prescribed Dose: 45 Gy
Reference Point Dosage Given to Date: 18 Gy
Reference Point Session Dosage Given: 1.8 Gy
Session Number: 10

## 2022-02-02 ENCOUNTER — Other Ambulatory Visit: Payer: Self-pay

## 2022-02-02 ENCOUNTER — Ambulatory Visit
Admission: RE | Admit: 2022-02-02 | Discharge: 2022-02-02 | Disposition: A | Discharge status: Home / Self Care | Payer: Medicare Other | Source: Ambulatory Visit | Attending: Radiation Oncology | Admitting: Radiation Oncology

## 2022-02-02 DIAGNOSIS — Z191 Hormone sensitive malignancy status: Secondary | ICD-10-CM | POA: Diagnosis not present

## 2022-02-02 DIAGNOSIS — C61 Malignant neoplasm of prostate: Secondary | ICD-10-CM | POA: Diagnosis not present

## 2022-02-02 DIAGNOSIS — Z510 Encounter for antineoplastic radiation therapy: Secondary | ICD-10-CM | POA: Diagnosis not present

## 2022-02-02 LAB — RAD ONC ARIA SESSION SUMMARY
Course Elapsed Days: 14
Plan Fractions Treated to Date: 11
Plan Prescribed Dose Per Fraction: 1.8 Gy
Plan Total Fractions Prescribed: 25
Plan Total Prescribed Dose: 45 Gy
Reference Point Dosage Given to Date: 19.8 Gy
Reference Point Session Dosage Given: 1.8 Gy
Session Number: 11

## 2022-02-03 ENCOUNTER — Other Ambulatory Visit: Payer: Self-pay

## 2022-02-03 ENCOUNTER — Ambulatory Visit
Admission: RE | Admit: 2022-02-03 | Discharge: 2022-02-03 | Disposition: A | Discharge status: Home / Self Care | Payer: Medicare Other | Source: Ambulatory Visit | Attending: Radiation Oncology | Admitting: Radiation Oncology

## 2022-02-03 DIAGNOSIS — C61 Malignant neoplasm of prostate: Secondary | ICD-10-CM | POA: Diagnosis not present

## 2022-02-03 DIAGNOSIS — Z191 Hormone sensitive malignancy status: Secondary | ICD-10-CM | POA: Diagnosis not present

## 2022-02-03 DIAGNOSIS — Z510 Encounter for antineoplastic radiation therapy: Secondary | ICD-10-CM | POA: Diagnosis not present

## 2022-02-03 LAB — RAD ONC ARIA SESSION SUMMARY
Course Elapsed Days: 15
Plan Fractions Treated to Date: 12
Plan Prescribed Dose Per Fraction: 1.8 Gy
Plan Total Fractions Prescribed: 25
Plan Total Prescribed Dose: 45 Gy
Reference Point Dosage Given to Date: 21.6 Gy
Reference Point Session Dosage Given: 1.8 Gy
Session Number: 12

## 2022-02-04 ENCOUNTER — Other Ambulatory Visit: Payer: Self-pay

## 2022-02-04 ENCOUNTER — Ambulatory Visit
Admission: RE | Admit: 2022-02-04 | Discharge: 2022-02-04 | Disposition: A | Discharge status: Home / Self Care | Payer: Medicare Other | Source: Ambulatory Visit | Attending: Radiation Oncology | Admitting: Radiation Oncology

## 2022-02-04 DIAGNOSIS — C61 Malignant neoplasm of prostate: Secondary | ICD-10-CM | POA: Diagnosis not present

## 2022-02-04 DIAGNOSIS — Z191 Hormone sensitive malignancy status: Secondary | ICD-10-CM | POA: Diagnosis not present

## 2022-02-04 DIAGNOSIS — Z510 Encounter for antineoplastic radiation therapy: Secondary | ICD-10-CM | POA: Diagnosis not present

## 2022-02-04 LAB — RAD ONC ARIA SESSION SUMMARY
Course Elapsed Days: 16
Plan Fractions Treated to Date: 13
Plan Prescribed Dose Per Fraction: 1.8 Gy
Plan Total Fractions Prescribed: 25
Plan Total Prescribed Dose: 45 Gy
Reference Point Dosage Given to Date: 23.4 Gy
Reference Point Session Dosage Given: 1.8 Gy
Session Number: 13

## 2022-02-07 ENCOUNTER — Ambulatory Visit
Admission: RE | Admit: 2022-02-07 | Discharge: 2022-02-07 | Disposition: A | Discharge status: Home / Self Care | Payer: Medicare Other | Source: Ambulatory Visit | Attending: Radiation Oncology | Admitting: Radiation Oncology

## 2022-02-07 ENCOUNTER — Other Ambulatory Visit: Payer: Self-pay

## 2022-02-07 DIAGNOSIS — Z191 Hormone sensitive malignancy status: Secondary | ICD-10-CM | POA: Diagnosis not present

## 2022-02-07 DIAGNOSIS — C61 Malignant neoplasm of prostate: Secondary | ICD-10-CM | POA: Diagnosis not present

## 2022-02-07 DIAGNOSIS — Z510 Encounter for antineoplastic radiation therapy: Secondary | ICD-10-CM | POA: Diagnosis not present

## 2022-02-07 LAB — RAD ONC ARIA SESSION SUMMARY
Course Elapsed Days: 19
Plan Fractions Treated to Date: 14
Plan Prescribed Dose Per Fraction: 1.8 Gy
Plan Total Fractions Prescribed: 25
Plan Total Prescribed Dose: 45 Gy
Reference Point Dosage Given to Date: 25.2 Gy
Reference Point Session Dosage Given: 1.8 Gy
Session Number: 14

## 2022-02-08 ENCOUNTER — Ambulatory Visit
Admission: RE | Admit: 2022-02-08 | Discharge: 2022-02-08 | Disposition: A | Discharge status: Home / Self Care | Payer: Medicare Other | Source: Ambulatory Visit | Attending: Radiation Oncology | Admitting: Radiation Oncology

## 2022-02-08 ENCOUNTER — Other Ambulatory Visit: Payer: Self-pay

## 2022-02-08 DIAGNOSIS — Z510 Encounter for antineoplastic radiation therapy: Secondary | ICD-10-CM | POA: Diagnosis not present

## 2022-02-08 DIAGNOSIS — Z191 Hormone sensitive malignancy status: Secondary | ICD-10-CM | POA: Diagnosis not present

## 2022-02-08 DIAGNOSIS — C61 Malignant neoplasm of prostate: Secondary | ICD-10-CM | POA: Diagnosis not present

## 2022-02-08 LAB — RAD ONC ARIA SESSION SUMMARY
Course Elapsed Days: 20
Plan Fractions Treated to Date: 15
Plan Prescribed Dose Per Fraction: 1.8 Gy
Plan Total Fractions Prescribed: 25
Plan Total Prescribed Dose: 45 Gy
Reference Point Dosage Given to Date: 27 Gy
Reference Point Session Dosage Given: 1.8 Gy
Session Number: 15

## 2022-02-09 ENCOUNTER — Other Ambulatory Visit: Payer: Self-pay

## 2022-02-09 ENCOUNTER — Ambulatory Visit
Admission: RE | Admit: 2022-02-09 | Discharge: 2022-02-09 | Disposition: A | Discharge status: Home / Self Care | Payer: Medicare Other | Source: Ambulatory Visit | Attending: Radiation Oncology | Admitting: Radiation Oncology

## 2022-02-09 DIAGNOSIS — Z191 Hormone sensitive malignancy status: Secondary | ICD-10-CM | POA: Diagnosis not present

## 2022-02-09 DIAGNOSIS — C61 Malignant neoplasm of prostate: Secondary | ICD-10-CM | POA: Diagnosis not present

## 2022-02-09 DIAGNOSIS — Z510 Encounter for antineoplastic radiation therapy: Secondary | ICD-10-CM | POA: Diagnosis not present

## 2022-02-09 LAB — RAD ONC ARIA SESSION SUMMARY
Course Elapsed Days: 21
Plan Fractions Treated to Date: 16
Plan Prescribed Dose Per Fraction: 1.8 Gy
Plan Total Fractions Prescribed: 25
Plan Total Prescribed Dose: 45 Gy
Reference Point Dosage Given to Date: 28.8 Gy
Reference Point Session Dosage Given: 1.8 Gy
Session Number: 16

## 2022-02-10 ENCOUNTER — Ambulatory Visit
Admission: RE | Admit: 2022-02-10 | Discharge: 2022-02-10 | Disposition: A | Discharge status: Home / Self Care | Payer: Medicare Other | Source: Ambulatory Visit | Attending: Radiation Oncology | Admitting: Radiation Oncology

## 2022-02-10 ENCOUNTER — Other Ambulatory Visit: Payer: Self-pay

## 2022-02-10 DIAGNOSIS — Z191 Hormone sensitive malignancy status: Secondary | ICD-10-CM | POA: Diagnosis not present

## 2022-02-10 DIAGNOSIS — C61 Malignant neoplasm of prostate: Secondary | ICD-10-CM | POA: Diagnosis not present

## 2022-02-10 DIAGNOSIS — Z510 Encounter for antineoplastic radiation therapy: Secondary | ICD-10-CM | POA: Diagnosis not present

## 2022-02-10 LAB — RAD ONC ARIA SESSION SUMMARY
Course Elapsed Days: 22
Plan Fractions Treated to Date: 17
Plan Prescribed Dose Per Fraction: 1.8 Gy
Plan Total Fractions Prescribed: 25
Plan Total Prescribed Dose: 45 Gy
Reference Point Dosage Given to Date: 30.6 Gy
Reference Point Session Dosage Given: 1.8 Gy
Session Number: 17

## 2022-02-11 ENCOUNTER — Ambulatory Visit
Admission: RE | Admit: 2022-02-11 | Discharge: 2022-02-11 | Disposition: A | Discharge status: Home / Self Care | Payer: Medicare Other | Source: Ambulatory Visit | Attending: Radiation Oncology | Admitting: Radiation Oncology

## 2022-02-11 ENCOUNTER — Other Ambulatory Visit: Payer: Self-pay

## 2022-02-11 ENCOUNTER — Ambulatory Visit (INDEPENDENT_AMBULATORY_CARE_PROVIDER_SITE_OTHER): Payer: Medicare Other

## 2022-02-11 ENCOUNTER — Ambulatory Visit (INDEPENDENT_AMBULATORY_CARE_PROVIDER_SITE_OTHER): Payer: Medicare Other | Admitting: Adult Health

## 2022-02-11 ENCOUNTER — Encounter: Payer: Self-pay | Admitting: Adult Health

## 2022-02-11 VITALS — BP 120/82 | HR 94 | Temp 99.3°F | Ht 64.0 in | Wt 176.0 lb

## 2022-02-11 DIAGNOSIS — Z191 Hormone sensitive malignancy status: Secondary | ICD-10-CM | POA: Diagnosis not present

## 2022-02-11 DIAGNOSIS — C61 Malignant neoplasm of prostate: Secondary | ICD-10-CM | POA: Diagnosis not present

## 2022-02-11 DIAGNOSIS — R200 Anesthesia of skin: Secondary | ICD-10-CM

## 2022-02-11 DIAGNOSIS — R202 Paresthesia of skin: Secondary | ICD-10-CM

## 2022-02-11 DIAGNOSIS — Z510 Encounter for antineoplastic radiation therapy: Secondary | ICD-10-CM | POA: Diagnosis not present

## 2022-02-11 LAB — RAD ONC ARIA SESSION SUMMARY
Course Elapsed Days: 23
Plan Fractions Treated to Date: 18
Plan Prescribed Dose Per Fraction: 1.8 Gy
Plan Total Fractions Prescribed: 25
Plan Total Prescribed Dose: 45 Gy
Reference Point Dosage Given to Date: 32.4 Gy
Reference Point Session Dosage Given: 1.8 Gy
Session Number: 18

## 2022-02-11 NOTE — Progress Notes (Signed)
Subjective:    Patient ID: John Stone, male    DOB: 1950/09/01, 71 y.o.   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  He presents to the office today for an acute issue of tingling in bilateral hands x 3-4 months. He denies decreased grip strength. Seems to be worse at night. He does report that he is currently going through radiation for metastatic prostate cancer and is still on Eligard   Denies numbness or tinging in his lower extremities.   No radiating neuropathic discomfort. No neck pain.    Review of Systems See HPI   Past Medical History:  Diagnosis Date   Arthritis    BPH with obstruction/lower urinary tract symptoms    History of chronic prostatitis 2005   post op prostate bx's   History of urinary retention    Hyperlipemia, mixed    Hypertension    Metastatic castration-sensitive adenocarcinoma of prostate ([place]) 08/2021   urologist--- dr newsome/  oncologist--- dr Alen Blew;  dx 01/ 2023,  gleason 8, PSA 60.7,  w/ pelvic adenopathy include left para-aortic lymph node   Pre-diabetes    Wears dentures    upper    Social History   Socioeconomic History   Marital status: Married    Spouse name: Not on file   Number of children: Not on file   Years of education: Not on file   Highest education level: Not on file  Occupational History   Not on file  Tobacco Use   Smoking status: Never   Smokeless tobacco: Never  Vaping Use   Vaping Use: Never used  Substance and Sexual Activity   Alcohol use: No   Drug use: No   Sexual activity: Not on file  Other Topics Concern   Not on file  Social History Narrative   Married   8 kids    6 live locally.        Work in [employer]   He likes to stay active in [employer].    Social Determinants of Health   Financial Resource Strain: Low Risk  (12/23/2021)   Overall Financial Resource Strain (CARDIA)    Difficulty of Paying Living Expenses: Not hard at all  Food Insecurity: No Food Insecurity (12/23/2021)   Hunger Vital Sign    Worried About Running Out of Food in the Last Year: Never true    Ran Out of Food in the Last Year: Never true  Transportation Needs: No Transportation Needs (12/23/2021)   PRAPARE - Hydrologist (Medical): No    Lack of Transportation (Non-Medical): No  Physical Activity: Inactive (12/23/2021)   Exercise Vital Sign    Days of Exercise per Week: 0 days    Minutes of Exercise per Session: 0 min  Stress: No Stress Concern Present (12/23/2021)   [place]    Feeling of Stress : Not at all  Social Connections: [place] (12/23/2021)   Social Connection and Isolation Panel [NHANES]    Frequency of Communication with Friends and Family: More than three times a week    Frequency of Social Gatherings with Friends and Family: More than three times a week    Attends Religious Services: More  than 4 times per year    Active Member of Clubs or Organizations: Yes    Attends Archivist Meetings: More than 4 times per year    Marital Status: Married  Human resources officer Violence: Not At Risk (12/23/2021)   Humiliation, Afraid, Rape, and Kick questionnaire    Fear of Current or Ex-Partner: No    Emotionally Abused: No    Physically Abused: No    Sexually Abused: No    Past Surgical History:  Procedure Laterality Date   GOLD SEED IMPLANT N/A 12/29/2021   Procedure: GOLD SEED IMPLANT;  Surgeon: Lucas Mallow, MD;  Location: [facility];  Service: Urology;  Laterality: N/A;  [place]  11/2003   TUNA procedure by dr Durwin Reges    PROSTATECTOMY  04/22/2004   '@[facility]'$  by dr Nichola Sizer;  OPEN SIMPLE PROSTATECTOMY FOR BPH WITH CHRONIC URINARY RETENTION   SPACE OAR INSTILLATION N/A 12/29/2021   Procedure: SPACE OAR INSTILLATION;  Surgeon: Lucas Mallow, MD;  Location: [facility];  Service: Urology;  Laterality: N/A;    Family History  Problem Relation Age of Onset   Hypertension Mother        parent   Hypertension Maternal Grandfather        grandparent    Colon cancer Neg Hx    Colon polyps Neg Hx    Esophageal cancer Neg Hx    Rectal cancer Neg Hx    Stomach cancer Neg Hx     No Known Allergies  Current Outpatient Medications on File Prior to Visit  Medication Sig Dispense Refill   amLODipine (NORVASC) 5 MG tablet TAKE 1 TABLET(5 MG) BY MOUTH DAILY (Patient taking differently: Take 5 mg by mouth daily.) 90 tablet 3   lisinopril-hydrochlorothiazide (ZESTORETIC) 20-25 MG tablet Take 1 tablet by mouth daily. (Patient taking differently: Take 1 tablet by mouth daily.) 90 tablet 3   silodosin (RAPAFLO) 8 MG CAPS capsule Take 8 mg by mouth daily with breakfast.     simvastatin (ZOCOR) 20 MG tablet Take 1 tablet (20 mg total) by mouth daily. (Patient taking differently: Take 20 mg by mouth daily.) 90 tablet 3   No current facility-administered medications on file prior to visit.    BP 120/82   Pulse 94   Temp 99.3 F (37.4 C) (Oral)   Ht '5\' 4"'$  (1.626 m)   Wt 176 lb (79.8 kg)   SpO2 95%   BMI 30.21 kg/m       Objective:   Physical Exam Vitals and nursing note reviewed.  Constitutional:      Appearance: Normal appearance.  Cardiovascular:     Rate and Rhythm: Normal rate and regular rhythm.     Pulses: Normal pulses.     Heart sounds: Normal heart sounds.  Pulmonary:     Effort: Pulmonary effort is normal.     Breath sounds: Normal breath sounds.  Musculoskeletal:        General: Normal range of motion.     Right hand: Normal.     Left hand: Normal.  Skin:    General: Skin is  warm and dry.  Neurological:     General: No focal deficit present.     Mental Status: He is alert and oriented to person, place, and time.     Cranial Nerves: Cranial nerves 2-12 are intact.     Motor: Motor function is intact. No weakness, tremor, atrophy or abnormal muscle  tone.     Coordination: Coordination is intact.  Psychiatric:        Mood and Affect: Mood normal.        Behavior: Behavior normal.        Thought Content: Thought content normal.        Judgment: Judgment normal.       Assessment & Plan:  1. Numbness and tingling in both hands - likely due to treatment for prostate cancer but will get xray of cervical spine. We discussed gabapentin but decided against it due to side effects.  - He can try using OTC B12  - DG Cervical Spine Complete; Future  Dorothyann Peng, NP

## 2022-02-14 ENCOUNTER — Ambulatory Visit
Admission: RE | Admit: 2022-02-14 | Discharge: 2022-02-14 | Disposition: A | Discharge status: Home / Self Care | Payer: Medicare Other | Source: Ambulatory Visit | Attending: Radiation Oncology | Admitting: Radiation Oncology

## 2022-02-14 ENCOUNTER — Other Ambulatory Visit: Payer: Self-pay

## 2022-02-14 DIAGNOSIS — Z510 Encounter for antineoplastic radiation therapy: Secondary | ICD-10-CM | POA: Diagnosis not present

## 2022-02-14 DIAGNOSIS — Z191 Hormone sensitive malignancy status: Secondary | ICD-10-CM | POA: Diagnosis not present

## 2022-02-14 DIAGNOSIS — C61 Malignant neoplasm of prostate: Secondary | ICD-10-CM | POA: Diagnosis not present

## 2022-02-14 LAB — RAD ONC ARIA SESSION SUMMARY
Course Elapsed Days: 26
Plan Fractions Treated to Date: 19
Plan Prescribed Dose Per Fraction: 1.8 Gy
Plan Total Fractions Prescribed: 25
Plan Total Prescribed Dose: 45 Gy
Reference Point Dosage Given to Date: 34.2 Gy
Reference Point Session Dosage Given: 1.8 Gy
Session Number: 19

## 2022-02-16 ENCOUNTER — Other Ambulatory Visit: Payer: Self-pay

## 2022-02-16 ENCOUNTER — Ambulatory Visit: Payer: Medicare Other

## 2022-02-16 ENCOUNTER — Ambulatory Visit
Admission: RE | Admit: 2022-02-16 | Discharge: 2022-02-16 | Disposition: A | Discharge status: Home / Self Care | Payer: Medicare Other | Source: Ambulatory Visit | Attending: Radiation Oncology | Admitting: Radiation Oncology

## 2022-02-16 DIAGNOSIS — C61 Malignant neoplasm of prostate: Secondary | ICD-10-CM | POA: Diagnosis not present

## 2022-02-16 DIAGNOSIS — Z510 Encounter for antineoplastic radiation therapy: Secondary | ICD-10-CM | POA: Diagnosis not present

## 2022-02-16 DIAGNOSIS — Z191 Hormone sensitive malignancy status: Secondary | ICD-10-CM | POA: Diagnosis not present

## 2022-02-16 LAB — RAD ONC ARIA SESSION SUMMARY
Course Elapsed Days: 28
Plan Fractions Treated to Date: 20
Plan Prescribed Dose Per Fraction: 1.8 Gy
Plan Total Fractions Prescribed: 25
Plan Total Prescribed Dose: 45 Gy
Reference Point Dosage Given to Date: 36 Gy
Reference Point Session Dosage Given: 1.8 Gy
Session Number: 20

## 2022-02-17 ENCOUNTER — Other Ambulatory Visit: Payer: Self-pay

## 2022-02-17 ENCOUNTER — Ambulatory Visit: Payer: Medicare Other

## 2022-02-17 ENCOUNTER — Ambulatory Visit
Admission: RE | Admit: 2022-02-17 | Discharge: 2022-02-17 | Disposition: A | Discharge status: Home / Self Care | Payer: Medicare Other | Source: Ambulatory Visit | Attending: Radiation Oncology | Admitting: Radiation Oncology

## 2022-02-17 DIAGNOSIS — Z191 Hormone sensitive malignancy status: Secondary | ICD-10-CM | POA: Diagnosis not present

## 2022-02-17 DIAGNOSIS — Z510 Encounter for antineoplastic radiation therapy: Secondary | ICD-10-CM | POA: Diagnosis not present

## 2022-02-17 DIAGNOSIS — C61 Malignant neoplasm of prostate: Secondary | ICD-10-CM | POA: Diagnosis not present

## 2022-02-17 LAB — RAD ONC ARIA SESSION SUMMARY
Course Elapsed Days: 29
Plan Fractions Treated to Date: 21
Plan Prescribed Dose Per Fraction: 1.8 Gy
Plan Total Fractions Prescribed: 25
Plan Total Prescribed Dose: 45 Gy
Reference Point Dosage Given to Date: 37.8 Gy
Reference Point Session Dosage Given: 1.8 Gy
Session Number: 21

## 2022-02-18 ENCOUNTER — Other Ambulatory Visit: Payer: Self-pay

## 2022-02-18 ENCOUNTER — Ambulatory Visit
Admission: RE | Admit: 2022-02-18 | Discharge: 2022-02-18 | Disposition: A | Discharge status: Home / Self Care | Payer: Medicare Other | Source: Ambulatory Visit | Attending: Radiation Oncology | Admitting: Radiation Oncology

## 2022-02-18 DIAGNOSIS — Z510 Encounter for antineoplastic radiation therapy: Secondary | ICD-10-CM | POA: Diagnosis not present

## 2022-02-18 DIAGNOSIS — Z191 Hormone sensitive malignancy status: Secondary | ICD-10-CM | POA: Diagnosis not present

## 2022-02-18 DIAGNOSIS — C61 Malignant neoplasm of prostate: Secondary | ICD-10-CM | POA: Diagnosis not present

## 2022-02-18 LAB — RAD ONC ARIA SESSION SUMMARY
Course Elapsed Days: 30
Plan Fractions Treated to Date: 22
Plan Prescribed Dose Per Fraction: 1.8 Gy
Plan Total Fractions Prescribed: 25
Plan Total Prescribed Dose: 45 Gy
Reference Point Dosage Given to Date: 39.6 Gy
Reference Point Session Dosage Given: 1.8 Gy
Session Number: 22

## 2022-02-21 ENCOUNTER — Other Ambulatory Visit: Payer: Self-pay

## 2022-02-21 ENCOUNTER — Ambulatory Visit
Admission: RE | Admit: 2022-02-21 | Discharge: 2022-02-21 | Disposition: A | Discharge status: Home / Self Care | Payer: Medicare Other | Source: Ambulatory Visit | Attending: Radiation Oncology | Admitting: Radiation Oncology

## 2022-02-21 DIAGNOSIS — Z191 Hormone sensitive malignancy status: Secondary | ICD-10-CM | POA: Diagnosis not present

## 2022-02-21 DIAGNOSIS — C61 Malignant neoplasm of prostate: Secondary | ICD-10-CM | POA: Diagnosis not present

## 2022-02-21 DIAGNOSIS — Z510 Encounter for antineoplastic radiation therapy: Secondary | ICD-10-CM | POA: Diagnosis not present

## 2022-02-21 LAB — RAD ONC ARIA SESSION SUMMARY
Course Elapsed Days: 33
Plan Fractions Treated to Date: 23
Plan Prescribed Dose Per Fraction: 1.8 Gy
Plan Total Fractions Prescribed: 25
Plan Total Prescribed Dose: 45 Gy
Reference Point Dosage Given to Date: 41.4 Gy
Reference Point Session Dosage Given: 1.8 Gy
Session Number: 23

## 2022-02-22 ENCOUNTER — Ambulatory Visit
Admission: RE | Admit: 2022-02-22 | Discharge: 2022-02-22 | Disposition: A | Discharge status: Home / Self Care | Payer: Medicare Other | Source: Ambulatory Visit | Attending: Radiation Oncology | Admitting: Radiation Oncology

## 2022-02-22 ENCOUNTER — Other Ambulatory Visit: Payer: Self-pay

## 2022-02-22 DIAGNOSIS — C61 Malignant neoplasm of prostate: Secondary | ICD-10-CM | POA: Diagnosis not present

## 2022-02-22 DIAGNOSIS — Z191 Hormone sensitive malignancy status: Secondary | ICD-10-CM | POA: Diagnosis not present

## 2022-02-22 DIAGNOSIS — Z510 Encounter for antineoplastic radiation therapy: Secondary | ICD-10-CM | POA: Diagnosis not present

## 2022-02-22 LAB — RAD ONC ARIA SESSION SUMMARY
Course Elapsed Days: 34
Plan Fractions Treated to Date: 24
Plan Prescribed Dose Per Fraction: 1.8 Gy
Plan Total Fractions Prescribed: 25
Plan Total Prescribed Dose: 45 Gy
Reference Point Dosage Given to Date: 43.2 Gy
Reference Point Session Dosage Given: 1.8 Gy
Session Number: 24

## 2022-02-23 ENCOUNTER — Other Ambulatory Visit: Payer: Self-pay

## 2022-02-23 ENCOUNTER — Ambulatory Visit: Payer: Medicare Other

## 2022-02-23 ENCOUNTER — Ambulatory Visit
Admission: RE | Admit: 2022-02-23 | Discharge: 2022-02-23 | Disposition: A | Discharge status: Home / Self Care | Payer: Medicare Other | Source: Ambulatory Visit | Attending: Radiation Oncology | Admitting: Radiation Oncology

## 2022-02-23 DIAGNOSIS — C61 Malignant neoplasm of prostate: Secondary | ICD-10-CM | POA: Diagnosis not present

## 2022-02-23 DIAGNOSIS — Z191 Hormone sensitive malignancy status: Secondary | ICD-10-CM | POA: Diagnosis not present

## 2022-02-23 DIAGNOSIS — Z510 Encounter for antineoplastic radiation therapy: Secondary | ICD-10-CM | POA: Diagnosis not present

## 2022-02-23 LAB — RAD ONC ARIA SESSION SUMMARY
Course Elapsed Days: 35
Plan Fractions Treated to Date: 25
Plan Prescribed Dose Per Fraction: 1.8 Gy
Plan Total Fractions Prescribed: 25
Plan Total Prescribed Dose: 45 Gy
Reference Point Dosage Given to Date: 45 Gy
Reference Point Session Dosage Given: 1.8 Gy
Session Number: 25

## 2022-02-24 ENCOUNTER — Ambulatory Visit
Admission: RE | Admit: 2022-02-24 | Discharge: 2022-02-24 | Disposition: A | Discharge status: Home / Self Care | Payer: Medicare Other | Source: Ambulatory Visit | Attending: Radiation Oncology | Admitting: Radiation Oncology

## 2022-02-24 ENCOUNTER — Ambulatory Visit: Payer: Medicare Other

## 2022-02-24 ENCOUNTER — Other Ambulatory Visit: Payer: Self-pay

## 2022-02-24 DIAGNOSIS — Z191 Hormone sensitive malignancy status: Secondary | ICD-10-CM | POA: Diagnosis not present

## 2022-02-24 DIAGNOSIS — Z510 Encounter for antineoplastic radiation therapy: Secondary | ICD-10-CM | POA: Diagnosis not present

## 2022-02-24 DIAGNOSIS — C61 Malignant neoplasm of prostate: Secondary | ICD-10-CM | POA: Diagnosis not present

## 2022-02-24 LAB — RAD ONC ARIA SESSION SUMMARY
Course Elapsed Days: 36
Plan Fractions Treated to Date: 1
Plan Prescribed Dose Per Fraction: 2 Gy
Plan Total Fractions Prescribed: 15
Plan Total Prescribed Dose: 30 Gy
Reference Point Dosage Given to Date: 47 Gy
Reference Point Session Dosage Given: 2 Gy
Session Number: 26

## 2022-02-25 ENCOUNTER — Other Ambulatory Visit: Payer: Self-pay

## 2022-02-25 ENCOUNTER — Ambulatory Visit
Admission: RE | Admit: 2022-02-25 | Discharge: 2022-02-25 | Disposition: A | Discharge status: Home / Self Care | Payer: Medicare Other | Source: Ambulatory Visit | Attending: Radiation Oncology | Admitting: Radiation Oncology

## 2022-02-25 DIAGNOSIS — C61 Malignant neoplasm of prostate: Secondary | ICD-10-CM | POA: Diagnosis not present

## 2022-02-25 DIAGNOSIS — Z510 Encounter for antineoplastic radiation therapy: Secondary | ICD-10-CM | POA: Diagnosis not present

## 2022-02-25 DIAGNOSIS — Z191 Hormone sensitive malignancy status: Secondary | ICD-10-CM | POA: Diagnosis not present

## 2022-02-25 LAB — RAD ONC ARIA SESSION SUMMARY
Course Elapsed Days: 37
Plan Fractions Treated to Date: 2
Plan Prescribed Dose Per Fraction: 2 Gy
Plan Total Fractions Prescribed: 15
Plan Total Prescribed Dose: 30 Gy
Reference Point Dosage Given to Date: 49 Gy
Reference Point Session Dosage Given: 2 Gy
Session Number: 27

## 2022-02-28 ENCOUNTER — Ambulatory Visit
Admission: RE | Admit: 2022-02-28 | Discharge: 2022-02-28 | Disposition: A | Discharge status: Home / Self Care | Payer: Medicare Other | Source: Ambulatory Visit | Attending: Radiation Oncology | Admitting: Radiation Oncology

## 2022-02-28 ENCOUNTER — Other Ambulatory Visit: Payer: Self-pay

## 2022-02-28 DIAGNOSIS — Z510 Encounter for antineoplastic radiation therapy: Secondary | ICD-10-CM | POA: Diagnosis not present

## 2022-02-28 DIAGNOSIS — Z191 Hormone sensitive malignancy status: Secondary | ICD-10-CM | POA: Diagnosis not present

## 2022-02-28 DIAGNOSIS — C61 Malignant neoplasm of prostate: Secondary | ICD-10-CM | POA: Diagnosis not present

## 2022-02-28 LAB — RAD ONC ARIA SESSION SUMMARY
Course Elapsed Days: 40
Plan Fractions Treated to Date: 3
Plan Prescribed Dose Per Fraction: 2 Gy
Plan Total Fractions Prescribed: 15
Plan Total Prescribed Dose: 30 Gy
Reference Point Dosage Given to Date: 51 Gy
Reference Point Session Dosage Given: 2 Gy
Session Number: 28

## 2022-03-01 ENCOUNTER — Ambulatory Visit
Admission: RE | Admit: 2022-03-01 | Discharge: 2022-03-01 | Disposition: A | Discharge status: Home / Self Care | Payer: Medicare Other | Source: Ambulatory Visit | Attending: Radiation Oncology | Admitting: Radiation Oncology

## 2022-03-01 ENCOUNTER — Other Ambulatory Visit: Payer: Self-pay

## 2022-03-01 DIAGNOSIS — Z510 Encounter for antineoplastic radiation therapy: Secondary | ICD-10-CM | POA: Diagnosis not present

## 2022-03-01 DIAGNOSIS — C61 Malignant neoplasm of prostate: Secondary | ICD-10-CM | POA: Diagnosis not present

## 2022-03-01 DIAGNOSIS — Z191 Hormone sensitive malignancy status: Secondary | ICD-10-CM | POA: Diagnosis not present

## 2022-03-01 LAB — RAD ONC ARIA SESSION SUMMARY
Course Elapsed Days: 41
Plan Fractions Treated to Date: 4
Plan Prescribed Dose Per Fraction: 2 Gy
Plan Total Fractions Prescribed: 15
Plan Total Prescribed Dose: 30 Gy
Reference Point Dosage Given to Date: 53 Gy
Reference Point Session Dosage Given: 2 Gy
Session Number: 29

## 2022-03-02 ENCOUNTER — Other Ambulatory Visit: Payer: Self-pay

## 2022-03-02 ENCOUNTER — Ambulatory Visit
Admission: RE | Admit: 2022-03-02 | Discharge: 2022-03-02 | Disposition: A | Discharge status: Home / Self Care | Payer: Medicare Other | Source: Ambulatory Visit | Attending: Radiation Oncology | Admitting: Radiation Oncology

## 2022-03-02 DIAGNOSIS — Z510 Encounter for antineoplastic radiation therapy: Secondary | ICD-10-CM | POA: Diagnosis not present

## 2022-03-02 DIAGNOSIS — Z191 Hormone sensitive malignancy status: Secondary | ICD-10-CM | POA: Diagnosis not present

## 2022-03-02 DIAGNOSIS — C61 Malignant neoplasm of prostate: Secondary | ICD-10-CM | POA: Diagnosis not present

## 2022-03-02 LAB — RAD ONC ARIA SESSION SUMMARY
Course Elapsed Days: 42
Plan Fractions Treated to Date: 5
Plan Prescribed Dose Per Fraction: 2 Gy
Plan Total Fractions Prescribed: 15
Plan Total Prescribed Dose: 30 Gy
Reference Point Dosage Given to Date: 55 Gy
Reference Point Session Dosage Given: 2 Gy
Session Number: 30

## 2022-03-03 ENCOUNTER — Other Ambulatory Visit: Payer: Self-pay

## 2022-03-03 ENCOUNTER — Ambulatory Visit
Admission: RE | Admit: 2022-03-03 | Discharge: 2022-03-03 | Disposition: A | Discharge status: Home / Self Care | Payer: Medicare Other | Source: Ambulatory Visit | Attending: Radiation Oncology | Admitting: Radiation Oncology

## 2022-03-03 DIAGNOSIS — Z191 Hormone sensitive malignancy status: Secondary | ICD-10-CM | POA: Diagnosis not present

## 2022-03-03 DIAGNOSIS — C61 Malignant neoplasm of prostate: Secondary | ICD-10-CM | POA: Diagnosis not present

## 2022-03-03 DIAGNOSIS — Z510 Encounter for antineoplastic radiation therapy: Secondary | ICD-10-CM | POA: Diagnosis not present

## 2022-03-03 LAB — RAD ONC ARIA SESSION SUMMARY
Course Elapsed Days: 43
Plan Fractions Treated to Date: 6
Plan Prescribed Dose Per Fraction: 2 Gy
Plan Total Fractions Prescribed: 15
Plan Total Prescribed Dose: 30 Gy
Reference Point Dosage Given to Date: 57 Gy
Reference Point Session Dosage Given: 2 Gy
Session Number: 31

## 2022-03-04 ENCOUNTER — Ambulatory Visit
Admission: RE | Admit: 2022-03-04 | Discharge: 2022-03-04 | Disposition: A | Discharge status: Home / Self Care | Payer: Medicare Other | Source: Ambulatory Visit | Attending: Radiation Oncology | Admitting: Radiation Oncology

## 2022-03-04 ENCOUNTER — Other Ambulatory Visit: Payer: Self-pay

## 2022-03-04 ENCOUNTER — Ambulatory Visit: Payer: Medicare Other | Admitting: Adult Health

## 2022-03-04 DIAGNOSIS — Z191 Hormone sensitive malignancy status: Secondary | ICD-10-CM | POA: Diagnosis not present

## 2022-03-04 DIAGNOSIS — Z510 Encounter for antineoplastic radiation therapy: Secondary | ICD-10-CM | POA: Diagnosis not present

## 2022-03-04 DIAGNOSIS — C61 Malignant neoplasm of prostate: Secondary | ICD-10-CM | POA: Diagnosis not present

## 2022-03-04 LAB — RAD ONC ARIA SESSION SUMMARY
Course Elapsed Days: 44
Plan Fractions Treated to Date: 7
Plan Prescribed Dose Per Fraction: 2 Gy
Plan Total Fractions Prescribed: 15
Plan Total Prescribed Dose: 30 Gy
Reference Point Dosage Given to Date: 59 Gy
Reference Point Session Dosage Given: 2 Gy
Session Number: 32

## 2022-03-07 ENCOUNTER — Other Ambulatory Visit: Payer: Self-pay

## 2022-03-07 ENCOUNTER — Ambulatory Visit
Admission: RE | Admit: 2022-03-07 | Discharge: 2022-03-07 | Disposition: A | Discharge status: Home / Self Care | Payer: Medicare Other | Source: Ambulatory Visit | Attending: Radiation Oncology | Admitting: Radiation Oncology

## 2022-03-07 DIAGNOSIS — C61 Malignant neoplasm of prostate: Secondary | ICD-10-CM | POA: Diagnosis not present

## 2022-03-07 DIAGNOSIS — Z191 Hormone sensitive malignancy status: Secondary | ICD-10-CM | POA: Diagnosis not present

## 2022-03-07 DIAGNOSIS — Z510 Encounter for antineoplastic radiation therapy: Secondary | ICD-10-CM | POA: Diagnosis not present

## 2022-03-07 LAB — RAD ONC ARIA SESSION SUMMARY
Course Elapsed Days: 47
Plan Fractions Treated to Date: 8
Plan Prescribed Dose Per Fraction: 2 Gy
Plan Total Fractions Prescribed: 15
Plan Total Prescribed Dose: 30 Gy
Reference Point Dosage Given to Date: 61 Gy
Reference Point Session Dosage Given: 2 Gy
Session Number: 33

## 2022-03-08 ENCOUNTER — Ambulatory Visit
Admission: RE | Admit: 2022-03-08 | Discharge: 2022-03-08 | Disposition: A | Discharge status: Home / Self Care | Payer: Medicare Other | Source: Ambulatory Visit | Attending: Radiation Oncology | Admitting: Radiation Oncology

## 2022-03-08 ENCOUNTER — Other Ambulatory Visit: Payer: Self-pay

## 2022-03-08 DIAGNOSIS — C61 Malignant neoplasm of prostate: Secondary | ICD-10-CM | POA: Diagnosis not present

## 2022-03-08 DIAGNOSIS — Z510 Encounter for antineoplastic radiation therapy: Secondary | ICD-10-CM | POA: Diagnosis not present

## 2022-03-08 DIAGNOSIS — Z191 Hormone sensitive malignancy status: Secondary | ICD-10-CM | POA: Diagnosis not present

## 2022-03-08 LAB — RAD ONC ARIA SESSION SUMMARY
Course Elapsed Days: 48
Plan Fractions Treated to Date: 9
Plan Prescribed Dose Per Fraction: 2 Gy
Plan Total Fractions Prescribed: 15
Plan Total Prescribed Dose: 30 Gy
Reference Point Dosage Given to Date: 63 Gy
Reference Point Session Dosage Given: 2 Gy
Session Number: 34

## 2022-03-09 ENCOUNTER — Other Ambulatory Visit: Payer: Self-pay

## 2022-03-09 ENCOUNTER — Ambulatory Visit: Payer: Medicare Other | Admitting: Adult Health

## 2022-03-09 ENCOUNTER — Ambulatory Visit
Admission: RE | Admit: 2022-03-09 | Discharge: 2022-03-09 | Disposition: A | Discharge status: Home / Self Care | Payer: Medicare Other | Source: Ambulatory Visit | Attending: Radiation Oncology | Admitting: Radiation Oncology

## 2022-03-09 DIAGNOSIS — Z510 Encounter for antineoplastic radiation therapy: Secondary | ICD-10-CM | POA: Diagnosis not present

## 2022-03-09 DIAGNOSIS — C61 Malignant neoplasm of prostate: Secondary | ICD-10-CM | POA: Diagnosis not present

## 2022-03-09 DIAGNOSIS — Z191 Hormone sensitive malignancy status: Secondary | ICD-10-CM | POA: Diagnosis not present

## 2022-03-09 LAB — RAD ONC ARIA SESSION SUMMARY
Course Elapsed Days: 49
Plan Fractions Treated to Date: 10
Plan Prescribed Dose Per Fraction: 2 Gy
Plan Total Fractions Prescribed: 15
Plan Total Prescribed Dose: 30 Gy
Reference Point Dosage Given to Date: 65 Gy
Reference Point Session Dosage Given: 2 Gy
Session Number: 35

## 2022-03-09 NOTE — Progress Notes (Deleted)
Subjective:    Patient ID: John Stone, male    DOB: [birth year], 71 y.o.   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  He presents to the office today for three month follow up. During his CPE his A1c was 6.6. He was advised to work on lifestyle modifications and follow up in three months for recheck.    Review of Systems See HPI   Past Medical History:  Diagnosis Date   Arthritis    BPH with obstruction/lower urinary tract symptoms    History of chronic prostatitis 2005   post op prostate bx's   History of urinary retention    Hyperlipemia, mixed    Hypertension    Metastatic castration-sensitive adenocarcinoma of prostate ([place]) 08/2021   urologist--- dr newsome/  oncologist--- dr Alen Blew;  dx 01/ 2023,  gleason 8, PSA 60.7,  w/ pelvic adenopathy include left para-aortic lymph node   Pre-diabetes    Wears dentures    upper    Social History   Socioeconomic History   Marital status: Married    Spouse name: Not on file   Number of children: Not on file   Years of education: Not on file   Highest education level: Not on file  Occupational History   Not on file  Tobacco Use   Smoking status: Never   Smokeless tobacco: Never  Vaping Use   Vaping Use: Never used  Substance and Sexual Activity   Alcohol use: No   Drug use: No   Sexual activity: Not on file  Other Topics Concern   Not on file  Social History Narrative   Married   8 kids    6 live locally.       Work in [employer]   He likes to stay active in [employer].    Social Determinants of Health   Financial Resource Strain: Low Risk  (12/23/2021)   Overall Financial Resource Strain  (CARDIA)    Difficulty of Paying Living Expenses: Not hard at all  Food Insecurity: No Food Insecurity (12/23/2021)   Hunger Vital Sign    Worried About Running Out of Food in the Last Year: Never true    Ran Out of Food in the Last Year: Never true  Transportation Needs: No Transportation Needs (12/23/2021)   PRAPARE - Hydrologist (Medical): No    Lack of Transportation (Non-Medical): No  Physical Activity: Inactive (12/23/2021)   Exercise Vital Sign    Days of Exercise per Week: 0 days    Minutes of Exercise per Session: 0 min  Stress: No Stress Concern Present (12/23/2021)   [place]    Feeling of Stress : Not at all  Social Connections: [place] (12/23/2021)   Social Connection and Isolation Panel [NHANES]    Frequency of Communication with Friends and Family: More than three times a week    Frequency of Social Gatherings with Friends and Family: More than three times a week    Attends Religious Services: More than 4 times per year    Active Member of [employer] or Organizations: Yes    Attends Archivist Meetings: More than 4 times per year    Marital  Status: Married  Human resources officer Violence: Not At Risk (12/23/2021)   Humiliation, Afraid, Rape, and Kick questionnaire    Fear of Current or Ex-Partner: No    Emotionally Abused: No    Physically Abused: No    Sexually Abused: No    Past Surgical History:  Procedure Laterality Date   GOLD SEED IMPLANT N/A 12/29/2021   Procedure: GOLD SEED IMPLANT;  Surgeon: Lucas Mallow, MD;  Location: [facility];  Service: Urology;  Laterality: N/A;  [place]  11/2003   TUNA procedure by dr Durwin Reges   PROSTATECTOMY  04/22/2004   '@[facility]'$  by dr Nichola Sizer;  OPEN SIMPLE PROSTATECTOMY FOR BPH WITH CHRONIC URINARY RETENTION   SPACE OAR INSTILLATION N/A 12/29/2021   Procedure: SPACE OAR INSTILLATION;   Surgeon: Lucas Mallow, MD;  Location[facility];  Service: Urology;  Laterality: N/A;    Family History  Problem Relation Age of Onset   Hypertension Mother        parent   Hypertension Maternal Grandfather        grandparent    Colon cancer Neg Hx    Colon polyps Neg Hx    Esophageal cancer Neg Hx    Rectal cancer Neg Hx    Stomach cancer Neg Hx     No Known Allergies  Current Outpatient Medications on File Prior to Visit  Medication Sig Dispense Refill   amLODipine (NORVASC) 5 MG tablet TAKE 1 TABLET(5 MG) BY MOUTH DAILY (Patient taking differently: Take 5 mg by mouth daily.) 90 tablet 3   lisinopril-hydrochlorothiazide (ZESTORETIC) 20-25 MG tablet Take 1 tablet by mouth daily. (Patient taking differently: Take 1 tablet by mouth daily.) 90 tablet 3   silodosin (RAPAFLO) 8 MG CAPS capsule Take 8 mg by mouth daily with breakfast.     simvastatin (ZOCOR) 20 MG tablet Take 1 tablet (20 mg total) by mouth daily. (Patient taking differently: Take 20 mg by mouth daily.) 90 tablet 3   No current facility-administered medications on file prior to visit.    There were no vitals taken for this visit.      Objective:   Physical Exam Vitals and nursing note reviewed.  Constitutional:      Appearance: Normal appearance.  Cardiovascular:     Rate and Rhythm: Normal rate and regular rhythm.     Pulses: Normal pulses.     Heart sounds: Normal heart sounds.  Pulmonary:     Effort: Pulmonary effort is normal.     Breath sounds: Normal breath sounds.  Musculoskeletal:        General: Normal range of motion.  Skin:    General: Skin is warm and dry.  Neurological:     General: No focal deficit present.     Mental Status: He is alert and oriented to person, place, and time.  Psychiatric:        Mood and Affect: Mood normal.        Behavior: Behavior normal.        Thought Content: Thought content normal.        Judgment: Judgment normal.            Assessment & Plan:

## 2022-03-10 ENCOUNTER — Ambulatory Visit
Admission: RE | Admit: 2022-03-10 | Discharge: 2022-03-10 | Disposition: A | Discharge status: Home / Self Care | Payer: Medicare Other | Source: Ambulatory Visit | Attending: Radiation Oncology | Admitting: Radiation Oncology

## 2022-03-10 ENCOUNTER — Other Ambulatory Visit: Payer: Self-pay

## 2022-03-10 DIAGNOSIS — Z191 Hormone sensitive malignancy status: Secondary | ICD-10-CM | POA: Diagnosis not present

## 2022-03-10 DIAGNOSIS — Z510 Encounter for antineoplastic radiation therapy: Secondary | ICD-10-CM | POA: Diagnosis not present

## 2022-03-10 DIAGNOSIS — C61 Malignant neoplasm of prostate: Secondary | ICD-10-CM | POA: Diagnosis not present

## 2022-03-10 LAB — RAD ONC ARIA SESSION SUMMARY
Course Elapsed Days: 50
Plan Fractions Treated to Date: 11
Plan Prescribed Dose Per Fraction: 2 Gy
Plan Total Fractions Prescribed: 15
Plan Total Prescribed Dose: 30 Gy
Reference Point Dosage Given to Date: 67 Gy
Reference Point Session Dosage Given: 2 Gy
Session Number: 36

## 2022-03-11 ENCOUNTER — Ambulatory Visit
Admission: RE | Admit: 2022-03-11 | Discharge: 2022-03-11 | Disposition: A | Discharge status: Home / Self Care | Payer: Medicare Other | Source: Ambulatory Visit | Attending: Radiation Oncology | Admitting: Radiation Oncology

## 2022-03-11 ENCOUNTER — Other Ambulatory Visit: Payer: Self-pay

## 2022-03-11 DIAGNOSIS — Z510 Encounter for antineoplastic radiation therapy: Secondary | ICD-10-CM | POA: Diagnosis not present

## 2022-03-11 DIAGNOSIS — Z191 Hormone sensitive malignancy status: Secondary | ICD-10-CM | POA: Diagnosis not present

## 2022-03-11 DIAGNOSIS — C61 Malignant neoplasm of prostate: Secondary | ICD-10-CM | POA: Diagnosis not present

## 2022-03-11 LAB — RAD ONC ARIA SESSION SUMMARY
Course Elapsed Days: 51
Plan Fractions Treated to Date: 12
Plan Prescribed Dose Per Fraction: 2 Gy
Plan Total Fractions Prescribed: 15
Plan Total Prescribed Dose: 30 Gy
Reference Point Dosage Given to Date: 69 Gy
Reference Point Session Dosage Given: 2 Gy
Session Number: 37

## 2022-03-14 ENCOUNTER — Ambulatory Visit: Payer: Medicare Other

## 2022-03-15 ENCOUNTER — Other Ambulatory Visit: Payer: Self-pay

## 2022-03-15 ENCOUNTER — Ambulatory Visit
Admission: RE | Admit: 2022-03-15 | Discharge: 2022-03-15 | Disposition: A | Discharge status: Home / Self Care | Payer: Medicare Other | Source: Ambulatory Visit | Attending: Radiation Oncology | Admitting: Radiation Oncology

## 2022-03-15 DIAGNOSIS — Z191 Hormone sensitive malignancy status: Secondary | ICD-10-CM | POA: Diagnosis not present

## 2022-03-15 DIAGNOSIS — Z510 Encounter for antineoplastic radiation therapy: Secondary | ICD-10-CM | POA: Diagnosis not present

## 2022-03-15 DIAGNOSIS — C61 Malignant neoplasm of prostate: Secondary | ICD-10-CM | POA: Insufficient documentation

## 2022-03-15 LAB — RAD ONC ARIA SESSION SUMMARY
Course Elapsed Days: 55
Plan Fractions Treated to Date: 13
Plan Prescribed Dose Per Fraction: 2 Gy
Plan Total Fractions Prescribed: 15
Plan Total Prescribed Dose: 30 Gy
Reference Point Dosage Given to Date: 71 Gy
Reference Point Session Dosage Given: 2 Gy
Session Number: 38

## 2022-03-16 ENCOUNTER — Ambulatory Visit: Payer: Medicare Other

## 2022-03-16 ENCOUNTER — Other Ambulatory Visit: Payer: Self-pay

## 2022-03-16 ENCOUNTER — Ambulatory Visit
Admission: RE | Admit: 2022-03-16 | Discharge: 2022-03-16 | Disposition: A | Discharge status: Home / Self Care | Payer: Medicare Other | Source: Ambulatory Visit | Attending: Radiation Oncology | Admitting: Radiation Oncology

## 2022-03-16 DIAGNOSIS — Z191 Hormone sensitive malignancy status: Secondary | ICD-10-CM | POA: Diagnosis not present

## 2022-03-16 DIAGNOSIS — C61 Malignant neoplasm of prostate: Secondary | ICD-10-CM | POA: Diagnosis not present

## 2022-03-16 DIAGNOSIS — Z510 Encounter for antineoplastic radiation therapy: Secondary | ICD-10-CM | POA: Diagnosis not present

## 2022-03-16 LAB — RAD ONC ARIA SESSION SUMMARY
Course Elapsed Days: 56
Plan Fractions Treated to Date: 14
Plan Prescribed Dose Per Fraction: 2 Gy
Plan Total Fractions Prescribed: 15
Plan Total Prescribed Dose: 30 Gy
Reference Point Dosage Given to Date: 73 Gy
Reference Point Session Dosage Given: 2 Gy
Session Number: 39

## 2022-03-17 ENCOUNTER — Ambulatory Visit: Payer: Medicare Other | Admitting: Adult Health

## 2022-03-17 ENCOUNTER — Other Ambulatory Visit: Payer: Self-pay

## 2022-03-17 ENCOUNTER — Ambulatory Visit: Payer: Medicare Other

## 2022-03-17 ENCOUNTER — Ambulatory Visit
Admission: RE | Admit: 2022-03-17 | Discharge: 2022-03-17 | Disposition: A | Discharge status: Home / Self Care | Payer: Medicare Other | Source: Ambulatory Visit | Attending: Radiation Oncology | Admitting: Radiation Oncology

## 2022-03-17 ENCOUNTER — Encounter: Payer: Self-pay | Admitting: Urology

## 2022-03-17 DIAGNOSIS — C61 Malignant neoplasm of prostate: Secondary | ICD-10-CM | POA: Diagnosis not present

## 2022-03-17 DIAGNOSIS — Z191 Hormone sensitive malignancy status: Secondary | ICD-10-CM | POA: Diagnosis not present

## 2022-03-17 DIAGNOSIS — Z510 Encounter for antineoplastic radiation therapy: Secondary | ICD-10-CM | POA: Diagnosis not present

## 2022-03-17 LAB — RAD ONC ARIA SESSION SUMMARY
Course Elapsed Days: 57
Plan Fractions Treated to Date: 15
Plan Prescribed Dose Per Fraction: 2 Gy
Plan Total Fractions Prescribed: 15
Plan Total Prescribed Dose: 30 Gy
Reference Point Dosage Given to Date: 75 Gy
Reference Point Session Dosage Given: 2 Gy
Session Number: 40

## 2022-03-17 NOTE — Progress Notes (Signed)
Patient presented for final radiation treatment today.   RN spoke with wife, patient is having some urinary side effects, however, previously spoke with MD and was reassured these symptoms will subside with time, verbalized understanding.   RN updated wife that there will be a 1 month follow up post treatment, and will assess improvement as well at that time.    No further needs, encouraged patient/wife to call if any questions or concerns arise.

## 2022-03-17 NOTE — Progress Notes (Deleted)
Subjective:    Patient ID: John Stone, male    DOB: October 21, 1950, 71 y.o.   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  71 year old male who is being evaluated today for 63-monthfollow-up regarding diabetes and hypertension.  During his physical exam 3 months ago his A1c had increased to 6.6 baseline of 6.0.  We decided that we would try lifestyle modifications first, work on reducing carbs and sugars and try doing some aerobic exercise.  Pretension-this is currently managed with Norvasc 5 mg daily and lisinopril/hydrochlorothiazide 20-25 mg daily.  He denies dizziness, lightheadedness, chest pain, or shortness of breath   Review of Systems See HPI   Past Medical History:  Diagnosis Date   Arthritis    BPH with obstruction/lower urinary tract symptoms    History of chronic prostatitis 2005   post op prostate bx's   History of urinary retention    Hyperlipemia, mixed    Hypertension    Metastatic castration-sensitive adenocarcinoma of prostate ([place] 08/2021   urologist--- dr newsome/  oncologist--- dr sAlen Blew  dx 01/ 2023,  gleason 8, PSA 60.7,  w/ pelvic adenopathy include left para-aortic lymph node   Pre-diabetes    Wears dentures    upper    Social History   Socioeconomic History   Marital status: Married    Spouse name: Not on file   Number of children: Not on file   Years of education: Not on file   Highest education level: Not on file  Occupational History   Not on file  Tobacco Use   Smoking status: Never   Smokeless tobacco: Never  Vaping Use   Vaping Use: Never used  Substance and Sexual Activity   Alcohol use: No   Drug use: No   Sexual activity:  Not on file  Other Topics Concern   Not on file  Social History Narrative   Married   8 kids    6 live locally.       Work in [employer]  He likes to stay active in [employer]    Social Determinants of Health   Financial Resource Strain: Low Risk  (12/23/2021)   Overall Financial Resource Strain (CARDIA)    Difficulty of Paying Living Expenses: Not hard at all  Food Insecurity: No Food Insecurity (12/23/2021)   Hunger Vital Sign    Worried About Running Out of Food in the Last Year: Never true    Ran Out of Food in the Last Year: Never true  Transportation Needs: No Transportation Needs (12/23/2021)   PRAPARE - THydrologist(Medical): No    Lack of Transportation (Non-Medical): No  Physical Activity: Inactive (12/23/2021)   Exercise Vital Sign    Days of Exercise per Week: 0 days    Minutes of Exercise per Session: 0 min  Stress: No Stress Concern Present (12/23/2021)   [place]   Feeling of Stress : Not at all  Social Connections: [place](12/23/2021)   Social Connection and Isolation Panel [NHANES]    Frequency of Communication with Friends and Family: More than three times a week    Frequency of Social Gatherings with Friends and Family: More than  three times a week    Attends Religious Services: More than 4 times per year    Active Member of Clubs or Organizations: Yes    Attends Archivist Meetings: More than 4 times per year    Marital Status: Married  Human resources officer Violence: Not At Risk (12/23/2021)   Humiliation, Afraid, Rape, and Kick questionnaire    Fear of Current or Ex-Partner: No    Emotionally Abused: No    Physically Abused: No    Sexually Abused: No    Past Surgical History:  Procedure Laterality Date   GOLD SEED IMPLANT N/A 12/29/2021   Procedure: GOLD SEED IMPLANT;  Surgeon: Lucas Mallow, MD;  Location: [facility];  Service: Urology;  Laterality: N/A;  [place]  11/2003   TUNA procedure by dr Durwin Reges   PROSTATECTOMY  04/22/2004   '@[facility]'$  by dr Nichola Sizer;  OPEN SIMPLE PROSTATECTOMY FOR BPH WITH CHRONIC URINARY RETENTION   SPACE OAR INSTILLATION N/A 12/29/2021   Procedure: SPACE OAR INSTILLATION;  Surgeon: Lucas Mallow, MD;  Location: [facility];  Service: Urology;  Laterality: N/A;    Family History  Problem Relation Age of Onset   Hypertension Mother        parent   Hypertension Maternal Grandfather        grandparent    Colon cancer Neg Hx    Colon polyps Neg Hx    Esophageal cancer Neg Hx    Rectal cancer Neg Hx    Stomach cancer Neg Hx     No Known Allergies  Current Outpatient Medications on File Prior to Visit  Medication Sig Dispense Refill   amLODipine (NORVASC) 5 MG tablet TAKE 1 TABLET(5 MG) BY MOUTH DAILY (Patient taking differently: Take 5 mg by mouth daily.) 90 tablet 3   lisinopril-hydrochlorothiazide (ZESTORETIC) 20-25 MG tablet Take 1 tablet by mouth daily. (Patient taking differently: Take 1 tablet by mouth daily.) 90 tablet 3   silodosin (RAPAFLO) 8 MG CAPS capsule Take 8 mg by mouth daily with breakfast.     simvastatin (ZOCOR) 20 MG tablet Take 1 tablet (20 mg total) by mouth daily. (Patient taking differently: Take 20 mg by mouth daily.) 90 tablet 3   No current facility-administered medications on file prior to visit.    There were no vitals taken for this visit.      Objective:   Physical Exam Vitals and nursing note reviewed.  Constitutional:      Appearance: Normal appearance.  Cardiovascular:     Rate and Rhythm: Normal rate and regular rhythm.     Pulses: Normal pulses.     Heart sounds: Normal heart sounds.  Pulmonary:     Effort: Pulmonary effort is normal.     Breath sounds: Normal breath sounds.  Skin:    General: Skin is warm and dry.  Neurological:     General: No focal deficit present.      Mental Status: He is alert and oriented to person, place, and time.  Psychiatric:        Mood and Affect: Mood normal.        Behavior: Behavior normal.        Thought Content: Thought content normal.        Judgment: Judgment normal.        Assessment & Plan:

## 2022-03-18 ENCOUNTER — Ambulatory Visit: Payer: Medicare Other

## 2022-04-21 NOTE — Progress Notes (Signed)
Pt presented to [facility] on 3/7 for locally advanced adenocarcinoma of the prostate involving pelvic and periaortic lymph nodes, with a Gleason's score of 4+4 and a PSA of 60.7 and patient was started on ADT and had radiation therapy.   RN spoke with patient, no barriers to identify at this time.  Patient aware of follow up appointment with Ashlyn, and has upcoming appointment at [facility].  Patient encouraged to call with any questions or concerns.

## 2022-05-04 LAB — PSA: PSA: 0.046

## 2022-05-09 ENCOUNTER — Telehealth: Payer: Self-pay | Admitting: *Deleted

## 2022-05-09 NOTE — Telephone Encounter (Signed)
RETURNED PATIENT'S PHONE CALL, SPOKE WITH PATIENT. ?

## 2022-05-10 ENCOUNTER — Encounter: Payer: Self-pay | Admitting: Urology

## 2022-05-10 NOTE — Progress Notes (Signed)
Telephone appointment. I spoke w/ patient's spouse Mrs. Nghia Mcentee, verified her identity and began nursing interview. Spouse reports patient's urinary functions have greatly improved. No discomfort reported at this time. Patient is having some increasing RT hip pain 8/10, that is being addressed.  Meaningful use complete. I-PSS score of 15-moderate. No urinary management medications. Urology appt- 05/11/22 [facility]  Patient aware of 11:30am-05/11/22 telephone appointment w/ Ashlyn Bruning PA-C. I left my extension [phone] in case patient needs anything. Spouse verbalized understanding.  Patient contact [phone]

## 2022-05-11 ENCOUNTER — Ambulatory Visit
Admission: RE | Admit: 2022-05-11 | Discharge: 2022-05-11 | Disposition: A | Discharge status: Home / Self Care | Payer: Medicare Other | Source: Ambulatory Visit | Attending: Urology | Admitting: Urology

## 2022-05-11 DIAGNOSIS — C61 Malignant neoplasm of prostate: Secondary | ICD-10-CM

## 2022-05-11 DIAGNOSIS — R3914 Feeling of incomplete bladder emptying: Secondary | ICD-10-CM | POA: Diagnosis not present

## 2022-05-11 DIAGNOSIS — R8279 Other abnormal findings on microbiological examination of urine: Secondary | ICD-10-CM | POA: Diagnosis not present

## 2022-05-11 NOTE — Progress Notes (Signed)
  Radiation Oncology         [phone]) [phone] ________________________________  Name: John Stone MRN: [id number]  Date: 03/17/2022  DOB: 01-Jun-1951  End of Treatment Note  Diagnosis:   71 y.o. gentleman with locally advanced adenocarcinoma of the prostate involving pelvic and periaortic lymph nodes, with a Gleason's score of 4+4 and a PSA of 60.7.     Indication for treatment:  Curative, Definitive Radiotherapy concurrent with ADT      Radiation treatment dates:   01/19/22 - 03/17/22  Site/dose:  1. The prostate, seminal vesicles, and pelvic lymph nodes were initially treated to 45 Gy in 25 fractions of 1.8 Gy  2. The prostate and PET positive nodes were boosted to 75 Gy with 15 additional fractions of 2.0 Gy   Beams/energy:  1. The prostate, seminal vesicles, and pelvic lymph nodes were initially treated using VMAT intensity modulated radiotherapy delivering 6 megavolt photons. Image guidance was performed with CB-CT studies prior to each fraction. He was immobilized with a body fix lower extremity mold.  2. the prostate and PET positive nodes were boosted using VMAT intensity modulated radiotherapy delivering 6 megavolt photons. Image guidance was performed with CB-CT studies prior to each fraction. He was immobilized with a body fix lower extremity mold.  Narrative: The patient tolerated radiation treatment relatively well with only minor urinary irritation and modest fatigue.  He did report increased nocturia x3 as well as dysuria which was improved on Rapaflo.  He denied any abdominal pain, nausea, vomiting, diarrhea or constipation.  Plan: The patient has completed radiation treatment. He will return to radiation oncology clinic for routine followup in one month. I advised him to call or return sooner if he has any questions or concerns related to his recovery or treatment. ________________________________  Sheral Apley. Tammi Klippel, M.D.

## 2022-05-11 NOTE — Progress Notes (Signed)
Radiation Oncology         ([phone]) [phone] ________________________________  Name: Beatrice Sehgal MRN: [id number]  Date: 05/11/2022  DOB: Oct 14, 1950  Post Treatment Note  CC: Dorothyann Peng, NP  Remi Haggard, MD  Diagnosis:   71 y.o. gentleman with locally advanced adenocarcinoma of the prostate involving pelvic and periaortic lymph nodes, with a Gleason's score of 4+4 and a PSA of 60.7.     Interval Since Last Radiation:  7.5 weeks; concurrent with LT-ADT  01/19/22 - 03/17/22: 1. The prostate, seminal vesicles, and pelvic lymph nodes were initially treated to 45 Gy in 25 fractions of 1.8 Gy  2. The prostate and PET positive nodes were boosted to 75 Gy with 15 additional fractions of 2.0 Gy   Narrative:  I spoke with the patient to conduct his routine scheduled 1 month follow up visit via telephone to spare the patient unnecessary potential exposure in the healthcare setting during the current COVID-19 pandemic.  The patient was notified in advance and gave permission to proceed with this visit format.  He tolerated radiation treatment relatively well with only minor urinary irritation and modest fatigue.  He did report increased nocturia x3 as well as dysuria which was improved on Rapaflo.  He denied any abdominal pain, nausea, vomiting, diarrhea or constipation.                              On review of systems, the patient states that he is doing well in general.  His LUTS continue to gradually improve and he specifically denies dysuria, gross hematuria, straining to void, incomplete bladder emptying or incontinence.  He reports a healthy appetite and is maintaining his weight.  He denies abdominal pain, nausea, vomiting, diarrhea or constipation.  His energy level is gradually improving and he feels like he is tolerating the ADT fairly well.  He had a second injection this morning at Dr. Elmyra Ricks office and overall, is pleased with his progress to date.  ALLERGIES:  has No Known  Allergies.  Meds: Current Outpatient Medications  Medication Sig Dispense Refill   amLODipine (NORVASC) 5 MG tablet TAKE 1 TABLET(5 MG) BY MOUTH DAILY (Patient taking differently: Take 5 mg by mouth daily.) 90 tablet 3   lisinopril-hydrochlorothiazide (ZESTORETIC) 20-25 MG tablet Take 1 tablet by mouth daily. (Patient taking differently: Take 1 tablet by mouth daily.) 90 tablet 3   silodosin (RAPAFLO) 8 MG CAPS capsule Take 8 mg by mouth daily with breakfast.     simvastatin (ZOCOR) 20 MG tablet Take 1 tablet (20 mg total) by mouth daily. (Patient taking differently: Take 20 mg by mouth daily.) 90 tablet 3   No current facility-administered medications for this encounter.    Physical Findings:  vitals were not taken for this visit.  Pain Assessment Pain Score: 0-No pain/10 Unable to assess due to telephone follow-up visit format.  Lab Findings: Lab Results  Component Value Date   WBC 5.9 11/22/2021   HGB 13.3 12/29/2021   HCT 39.0 12/29/2021   MCV 84.6 11/22/2021   PLT 199 11/22/2021     Radiographic Findings: No results found.  Impression/Plan: 1. 71 y.o. gentleman with locally advanced adenocarcinoma of the prostate involving pelvic and periaortic lymph nodes, with a Gleason's score of 4+4 and a PSA of 60.7.  He will continue to follow up with urology for ongoing PSA determinations and had a recent follow-up appointment with Dr. Milford Cage this morning.  He understands what to expect with regards to PSA monitoring going forward.  He got another ADT injection this morning and feels like he continues to tolerate this fairly well.  I will look forward to following his response to treatment via correspondence with urology, and would be happy to continue to participate in his care if clinically indicated. I talked to the patient about what to expect in the future, including his risk for erectile dysfunction and rectal bleeding. I encouraged him to call or return to the office if he has  any questions regarding his previous radiation or possible radiation side effects. He was comfortable with this plan and will follow up as needed.        Nicholos Johns, PA-C

## 2022-05-13 ENCOUNTER — Encounter: Payer: Self-pay | Admitting: Adult Health

## 2022-05-13 DIAGNOSIS — M1611 Unilateral primary osteoarthritis, right hip: Secondary | ICD-10-CM | POA: Diagnosis not present

## 2022-05-17 ENCOUNTER — Encounter: Payer: Self-pay | Admitting: Adult Health

## 2022-05-17 ENCOUNTER — Ambulatory Visit (INDEPENDENT_AMBULATORY_CARE_PROVIDER_SITE_OTHER): Payer: Medicare Other | Admitting: Adult Health

## 2022-05-17 VITALS — BP 130/70 | HR 64 | Temp 98.6°F | Wt 184.6 lb

## 2022-05-17 DIAGNOSIS — Z23 Encounter for immunization: Secondary | ICD-10-CM | POA: Diagnosis not present

## 2022-05-17 DIAGNOSIS — Z01818 Encounter for other preprocedural examination: Secondary | ICD-10-CM

## 2022-05-17 LAB — COMPREHENSIVE METABOLIC PANEL
ALT: 14 U/L (ref 0–53)
AST: 17 U/L (ref 0–37)
Albumin: 4.2 g/dL (ref 3.5–5.2)
Alkaline Phosphatase: 59 U/L (ref 39–117)
BUN: 11 mg/dL (ref 6–23)
CO2: 31 mEq/L (ref 19–32)
Calcium: 9.8 mg/dL (ref 8.4–10.5)
Chloride: 101 mEq/L (ref 96–112)
Creatinine, Ser: 0.8 mg/dL (ref 0.40–1.50)
GFR: 88.61 mL/min (ref >60.00)
Glucose, Bld: 91 mg/dL (ref 70–99)
Potassium: 3.8 mEq/L (ref 3.5–5.1)
Sodium: 139 mEq/L (ref 135–145)
Total Bilirubin: 0.4 mg/dL (ref 0.2–1.2)
Total Protein: 7.5 g/dL (ref 6.0–8.3)

## 2022-05-17 LAB — CBC WITH DIFFERENTIAL/PLATELET
Basophils Absolute: 0 10*3/uL (ref 0.0–0.1)
Basophils Relative: 0.6 % (ref 0.0–3.0)
Eosinophils Absolute: 0.1 10*3/uL (ref 0.0–0.7)
Eosinophils Relative: 1.6 % (ref 0.0–5.0)
HCT: 35.8 % — ABNORMAL LOW (ref 39.0–52.0)
Hemoglobin: 11.8 g/dL — ABNORMAL LOW (ref 13.0–17.0)
Lymphocytes Relative: 14.1 % (ref 12.0–46.0)
Lymphs Abs: 0.6 10*3/uL — ABNORMAL LOW (ref 0.7–4.0)
MCHC: 32.9 g/dL (ref 30.0–36.0)
MCV: 90.4 fl (ref 78.0–100.0)
Monocytes Absolute: 0.6 10*3/uL (ref 0.1–1.0)
Monocytes Relative: 12.9 % — ABNORMAL HIGH (ref 3.0–12.0)
Neutro Abs: 3 10*3/uL (ref 1.4–7.7)
Neutrophils Relative %: 70.8 % (ref 43.0–77.0)
Platelets: 201 10*3/uL (ref 150.0–400.0)
RBC: 3.96 Mil/uL — ABNORMAL LOW (ref 4.22–5.81)
RDW: 14.4 % (ref 11.5–15.5)
WBC: 4.3 10*3/uL (ref 4.0–10.5)

## 2022-05-17 LAB — HEMOGLOBIN A1C: Hgb A1c MFr Bld: 5.7 % (ref 4.6–6.5)

## 2022-05-17 NOTE — Progress Notes (Signed)
Subjective:    Patient ID: John Stone, male    DOB: March 14, 1951, 71 y.o.   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  He presents to the office today for preoperative clearance. He is going to have a right hip replacement done by Dr. Delfino Lovett at [facility]. He is ready to have the surgery done so that he is out of pain when he walks.   He is happy to report that he finished up radiation therapy for prostate cancer.   Review of Systems See HPI   Past Medical History:  Diagnosis Date   Arthritis    BPH with obstruction/lower urinary tract symptoms    History of chronic prostatitis 2005   post op prostate bx's   History of urinary retention    Hyperlipemia, mixed    Hypertension    Metastatic castration-sensitive adenocarcinoma of prostate ([place]) 08/2021   urologist--- dr newsome/  oncologist--- dr Alen Blew;  dx 01/ 2023,  gleason 8, PSA 60.7,  w/ pelvic adenopathy include left para-aortic lymph node   Pre-diabetes    Wears dentures    upper    Social History   Socioeconomic History   Marital status: Married    Spouse name: Not on file   Number of children: Not on file   Years of education: Not on file   Highest education level: Not on file  Occupational History   Not on file  Tobacco Use   Smoking status: Never   Smokeless tobacco: Never  Vaping Use   Vaping Use: Never used  Substance and Sexual Activity   Alcohol use: No   Drug use: No   Sexual activity: Not on file  Other Topics Concern   Not on file  Social History Narrative   Married   8 kids    6 live locally.       Work in [employer]   He likes to stay active in [employer].    Social  Determinants of Health   Financial Resource Strain: Low Risk  (12/23/2021)   Overall Financial Resource Strain (CARDIA)    Difficulty of Paying Living Expenses: Not hard at all  Food Insecurity: No Food Insecurity (12/23/2021)   Hunger Vital Sign    Worried About Running Out of Food in the Last Year: Never true    Ran Out of Food in the Last Year: Never true  Transportation Needs: No Transportation Needs (12/23/2021)   PRAPARE - Hydrologist (Medical): No    Lack of Transportation (Non-Medical): No  Physical Activity: Inactive (12/23/2021)   Exercise Vital Sign    Days of Exercise per Week: 0 days    Minutes of Exercise per Session: 0 min  Stress: No Stress Concern Present (12/23/2021)   [place]    Feeling of Stress : Not at all  Social Connections: [place] (12/23/2021)   Social Connection and Isolation Panel [NHANES]    Frequency of Communication with Friends and Family: More than three times a week    Frequency of Social Gatherings with Friends and Family: More than three times a week    Attends Religious Services: More than 4 times per year    Active  Member of Clubs or Organizations: Yes    Attends Archivist Meetings: More than 4 times per year    Marital Status: Married  Human resources officer Violence: Not At Risk (12/23/2021)   Humiliation, Afraid, Rape, and Kick questionnaire    Fear of Current or Ex-Partner: No    Emotionally Abused: No    Physically Abused: No    Sexually Abused: No    Past Surgical History:  Procedure Laterality Date   GOLD SEED IMPLANT N/A 12/29/2021   Procedure: GOLD SEED IMPLANT;  Surgeon: Lucas Mallow, MD;  Location: [facility];  Service: Urology;  Laterality: N/A;  [place]  11/2003   TUNA procedure by dr Durwin Reges   PROSTATECTOMY  04/22/2004   '@[facility]'$  by dr Nichola Sizer;  OPEN SIMPLE PROSTATECTOMY FOR  BPH WITH CHRONIC URINARY RETENTION   SPACE OAR INSTILLATION N/A 12/29/2021   Procedure: SPACE OAR INSTILLATION;  Surgeon: Lucas Mallow, MD;  Location: [facility];  Service: Urology;  Laterality: N/A;    Family History  Problem Relation Age of Onset   Hypertension Mother        parent   Hypertension Maternal Grandfather        grandparent    Colon cancer Neg Hx    Colon polyps Neg Hx    Esophageal cancer Neg Hx    Rectal cancer Neg Hx    Stomach cancer Neg Hx     No Known Allergies  Current Outpatient Medications on File Prior to Visit  Medication Sig Dispense Refill   amLODipine (NORVASC) 5 MG tablet TAKE 1 TABLET(5 MG) BY MOUTH DAILY (Patient taking differently: Take 5 mg by mouth daily.) 90 tablet 3   lisinopril-hydrochlorothiazide (ZESTORETIC) 20-25 MG tablet Take 1 tablet by mouth daily. (Patient taking differently: Take 1 tablet by mouth daily.) 90 tablet 3   silodosin (RAPAFLO) 8 MG CAPS capsule Take 8 mg by mouth daily with breakfast.     simvastatin (ZOCOR) 20 MG tablet Take 1 tablet (20 mg total) by mouth daily. (Patient taking differently: Take 20 mg by mouth daily.) 90 tablet 3   No current facility-administered medications on file prior to visit.    BP 130/70 (BP Location: Left Arm, Patient Position: Sitting, Cuff Size: Large)   Pulse 64   Temp 98.6 F (37 C) (Oral)   Wt 184 lb 9.6 oz (83.7 kg)   SpO2 98%   BMI 31.69 kg/m       Objective:   Physical Exam Vitals and nursing note reviewed.  Constitutional:      Appearance: Normal appearance.  Cardiovascular:     Rate and Rhythm: Normal rate and regular rhythm.     Pulses: Normal pulses.     Heart sounds: Normal heart sounds.  Pulmonary:     Effort: Pulmonary effort is normal.     Breath sounds: Normal breath sounds. No stridor.  Abdominal:     General: Bowel sounds are normal.     Palpations: Abdomen is soft.  Skin:    General: Skin is warm and dry.     Capillary Refill:  Capillary refill takes less than 2 seconds.  Neurological:     General: No focal deficit present.     Mental Status: He is alert and oriented to person, place, and time.  Psychiatric:        Mood and Affect: Mood normal.        Behavior: Behavior normal.  Thought Content: Thought content normal.        Judgment: Judgment normal.       Assessment & Plan:   1. Preoperative clearance  - EKG 12-Lead- SR, Rate - SR, Rate 60, left atrial enlargement.  - CBC with Differential/Platelet; Future - Comprehensive metabolic panel; Future - Hemoglobin A1c; Future  Dorothyann Peng, NP

## 2022-05-26 DIAGNOSIS — N3000 Acute cystitis without hematuria: Secondary | ICD-10-CM | POA: Diagnosis not present

## 2022-05-27 ENCOUNTER — Encounter: Payer: Self-pay | Admitting: Adult Health

## 2022-06-01 DIAGNOSIS — M1611 Unilateral primary osteoarthritis, right hip: Secondary | ICD-10-CM | POA: Diagnosis not present

## 2022-06-01 DIAGNOSIS — Z01812 Encounter for preprocedural laboratory examination: Secondary | ICD-10-CM | POA: Diagnosis not present

## 2022-06-17 DIAGNOSIS — M1611 Unilateral primary osteoarthritis, right hip: Secondary | ICD-10-CM | POA: Diagnosis not present

## 2022-07-01 DIAGNOSIS — Z96641 Presence of right artificial hip joint: Secondary | ICD-10-CM | POA: Diagnosis not present

## 2022-07-01 DIAGNOSIS — Z471 Aftercare following joint replacement surgery: Secondary | ICD-10-CM | POA: Diagnosis not present

## 2022-07-28 ENCOUNTER — Encounter: Payer: Self-pay | Admitting: Adult Health

## 2022-07-28 ENCOUNTER — Ambulatory Visit (INDEPENDENT_AMBULATORY_CARE_PROVIDER_SITE_OTHER): Payer: Medicare Other | Admitting: Adult Health

## 2022-07-28 VITALS — BP 122/80 | HR 70 | Temp 98.2°F | Ht 64.0 in | Wt 189.0 lb

## 2022-07-28 DIAGNOSIS — M5412 Radiculopathy, cervical region: Secondary | ICD-10-CM | POA: Diagnosis not present

## 2022-07-28 NOTE — Progress Notes (Signed)
Subjective:    Patient ID: John Stone, male    DOB: 22-Mar-1951, 71 y.o.   MRN: [id number]  HPI 71 year old male who  has a past medical history of Arthritis, BPH with obstruction/lower urinary tract symptoms, History of chronic prostatitis (2005), History of urinary retention, Hyperlipemia, mixed, Hypertension, Metastatic castration-sensitive adenocarcinoma of prostate ([place]) (08/2021), Pre-diabetes, and Wears dentures.  Presents to the office today for follow-up regarding cervical radiculopathy symptoms.  Last seen for this issue roughly 6 months ago when he had sporadic tingling and numbness in bilateral hands x 3 to 4 months.  At this time he denied any decreased grip strength.  At this time he was going through radiation for metastatic prostate cancer.  His cervical spine x-ray at this time showed mild disc space height loss at C5-C7 and facet arthropathy.  Today he reports that he continues to have the sensation in both hands.  Continues to deny loss of grip strength.  He is interested in treatment but does not want to be "caught on".    Review of Systems See HPI   Past Medical History:  Diagnosis Date   Arthritis    BPH with obstruction/lower urinary tract symptoms    History of chronic prostatitis 2005   post op prostate bx's   History of urinary retention    Hyperlipemia, mixed    Hypertension    Metastatic castration-sensitive adenocarcinoma of prostate ([place]) 08/2021   urologist--- dr newsome/  oncologist--- dr Alen Blew;  dx 01/ 2023,  gleason 8, PSA 60.7,  w/ pelvic adenopathy include left para-aortic lymph node   Pre-diabetes    Wears dentures    upper    Social History   Socioeconomic History   Marital status: Married    Spouse name: Not on file   Number of children: Not on file   Years of education: Not on file   Highest education level: Not on file  Occupational History   Not on file  Tobacco Use   Smoking status: Never   Smokeless tobacco: Never   Vaping Use   Vaping Use: Never used  Substance and Sexual Activity   Alcohol use: No   Drug use: No   Sexual activity: Not on file  Other Topics Concern   Not on file  Social History Narrative   Married   8 kids    6 live locally.       Work in [employer]   He likes to stay active in [employer].    Social Determinants of Health   Financial Resource Strain: Low Risk  (12/23/2021)   Overall Financial Resource Strain (CARDIA)    Difficulty of Paying Living Expenses: Not hard at all  Food Insecurity: No Food Insecurity (12/23/2021)   Hunger Vital Sign    Worried About Running Out of Food in the Last Year: Never true    Ran Out of Food in the Last Year: Never true  Transportation Needs: No Transportation Needs (12/23/2021)   PRAPARE - Hydrologist (Medical): No    Lack of Transportation (Non-Medical): No  Physical Activity: Inactive (12/23/2021)   Exercise Vital Sign    Days of Exercise per Week: 0 days    Minutes of Exercise per Session: 0 min  Stress: No Stress Concern Present (12/23/2021)   [place]    Feeling of Stress : Not at all  Social Connections: [place] (12/23/2021)  Social Connection and Isolation Panel [NHANES]    Frequency of Communication with Friends and Family: More than three times a week    Frequency of Social Gatherings with Friends and Family: More than three times a week    Attends Religious Services: More than 4 times per year    Active Member of Clubs or Organizations: Yes    Attends Archivist Meetings: More than 4 times per year    Marital Status: Married  Human resources officer Violence: Not At Risk (12/23/2021)   Humiliation, Afraid, Rape, and Kick questionnaire    Fear of Current or Ex-Partner: No    Emotionally Abused: No    Physically Abused: No    Sexually Abused: No    Past Surgical History:  Procedure Laterality Date    GOLD SEED IMPLANT N/A 12/29/2021   Procedure: GOLD SEED IMPLANT;  Surgeon: Lucas Mallow, MD;  Location: [facility];  Service: Urology;  Laterality: N/A;  [place]  11/2003   TUNA procedure by dr Durwin Reges   PROSTATECTOMY  04/22/2004   '@[facility]'$  by dr Nichola Sizer;  OPEN SIMPLE PROSTATECTOMY FOR BPH WITH CHRONIC URINARY RETENTION   SPACE OAR INSTILLATION N/A 12/29/2021   Procedure: SPACE OAR INSTILLATION;  Surgeon: Lucas Mallow, MD;  Location: [facility];  Service: Urology;  Laterality: N/A;    Family History  Problem Relation Age of Onset   Hypertension Mother        parent   Hypertension Maternal Grandfather        grandparent    Colon cancer Neg Hx    Colon polyps Neg Hx    Esophageal cancer Neg Hx    Rectal cancer Neg Hx    Stomach cancer Neg Hx     No Known Allergies  Current Outpatient Medications on File Prior to Visit  Medication Sig Dispense Refill   amLODipine (NORVASC) 5 MG tablet TAKE 1 TABLET(5 MG) BY MOUTH DAILY (Patient taking differently: Take 5 mg by mouth daily.) 90 tablet 3   lisinopril-hydrochlorothiazide (ZESTORETIC) 20-25 MG tablet Take 1 tablet by mouth daily. (Patient taking differently: Take 1 tablet by mouth daily.) 90 tablet 3   silodosin (RAPAFLO) 8 MG CAPS capsule Take 8 mg by mouth daily with breakfast.     simvastatin (ZOCOR) 20 MG tablet Take 1 tablet (20 mg total) by mouth daily. (Patient taking differently: Take 20 mg by mouth daily.) 90 tablet 3   No current facility-administered medications on file prior to visit.    BP 122/80   Pulse 70   Temp 98.2 F (36.8 C) (Oral)   Ht '5\' 4"'$  (1.626 m)   Wt 189 lb (85.7 kg)   SpO2 99%   BMI 32.44 kg/m       Objective:   Physical Exam Vitals and nursing note reviewed.  Constitutional:      Appearance: Normal appearance.  Cardiovascular:     Rate and Rhythm: Normal rate and regular rhythm.     Pulses: Normal pulses.     Heart sounds: Normal  heart sounds.  Pulmonary:     Effort: Pulmonary effort is normal.     Breath sounds: Normal breath sounds.  Musculoskeletal:        General: Normal range of motion.  Skin:    General: Skin is warm and dry.  Neurological:     General: No focal deficit present.     Mental Status: He is oriented to person,  place, and time.     Cranial Nerves: No cranial nerve deficit.     Sensory: No sensory deficit.     Motor: No weakness.     Deep Tendon Reflexes: Reflexes normal.  Psychiatric:        Mood and Affect: Mood normal.        Behavior: Behavior normal.        Thought Content: Thought content normal.        Judgment: Judgment normal.       Assessment & Plan:  1. Cervical radiculopathy -We discussed options including trialing steroids and/or physical therapy.  He would like to refrain from steroids at this time.  Will refer to physical therapy for further evaluation - Ambulatory referral to Physical Therapy  Dorothyann Peng, NP

## 2022-07-29 DIAGNOSIS — Z96641 Presence of right artificial hip joint: Secondary | ICD-10-CM | POA: Diagnosis not present

## 2022-07-29 DIAGNOSIS — Z471 Aftercare following joint replacement surgery: Secondary | ICD-10-CM | POA: Diagnosis not present

## 2022-09-20 ENCOUNTER — Other Ambulatory Visit: Payer: Self-pay | Admitting: Adult Health

## 2022-09-20 DIAGNOSIS — I10 Essential (primary) hypertension: Secondary | ICD-10-CM

## 2022-10-14 ENCOUNTER — Encounter: Payer: Self-pay | Admitting: Adult Health

## 2022-10-14 ENCOUNTER — Ambulatory Visit (INDEPENDENT_AMBULATORY_CARE_PROVIDER_SITE_OTHER): Payer: Medicare Other | Admitting: Adult Health

## 2022-10-14 VITALS — BP 130/80 | HR 56 | Temp 98.3°F | Ht 64.0 in | Wt 199.0 lb

## 2022-10-14 DIAGNOSIS — M19041 Primary osteoarthritis, right hand: Secondary | ICD-10-CM | POA: Diagnosis not present

## 2022-10-14 DIAGNOSIS — M19042 Primary osteoarthritis, left hand: Secondary | ICD-10-CM

## 2022-10-14 NOTE — Patient Instructions (Signed)
It was great seeing you today   For the arthritis in your joints you can try Aleve or Voltaren Gel.

## 2022-10-14 NOTE — Progress Notes (Signed)
Subjective:    Patient ID: John Stone, male    DOB: Apr 26, 1951, 72 y.o.   MRN: [id number]  HPI  72 year old male who  has a past medical history of Arthritis, BPH with obstruction/lower urinary tract symptoms, History of chronic prostatitis (2005), History of urinary retention, Hyperlipemia, mixed, Hypertension, Metastatic castration-sensitive adenocarcinoma of prostate ([place]) (08/2021), Pre-diabetes, and Wears dentures.  He presents to the office today for chronic joint pain in both hands. He feels as though the joints in both hands are stiff in the morning and then the stiffness improve throughout the day. He denies swelling of the joints, redness or warmth. He just started using OTC Voltaren Gel one day ago. Unsure if it helped   Review of Systems See HPI   Past Medical History:  Diagnosis Date   Arthritis    BPH with obstruction/lower urinary tract symptoms    History of chronic prostatitis 2005   post op prostate bx's   History of urinary retention    Hyperlipemia, mixed    Hypertension    Metastatic castration-sensitive adenocarcinoma of prostate ([place]) 08/2021   urologist--- dr newsome/  oncologist--- dr Alen Blew;  dx 01/ 2023,  gleason 8, PSA 60.7,  w/ pelvic adenopathy include left para-aortic lymph node   Pre-diabetes    Wears dentures    upper    Social History   Socioeconomic History   Marital status: Married    Spouse name: Not on file   Number of children: Not on file   Years of education: Not on file   Highest education level: Not on file  Occupational History   Not on file  Tobacco Use   Smoking status: Never   Smokeless tobacco: Never  Vaping Use   Vaping Use: Never used  Substance and Sexual Activity   Alcohol use: No   Drug use: No   Sexual activity: Not on file  Other Topics Concern   Not on file  Social History Narrative   Married   8 kids    6 live locally.       Work in [employer]   He likes to stay active in [employer].     Social Determinants of Health   Financial Resource Strain: Low Risk  (12/23/2021)   Overall Financial Resource Strain (CARDIA)    Difficulty of Paying Living Expenses: Not hard at all  Food Insecurity: No Food Insecurity (12/23/2021)   Hunger Vital Sign    Worried About Running Out of Food in the Last Year: Never true    Ran Out of Food in the Last Year: Never true  Transportation Needs: No Transportation Needs (12/23/2021)   PRAPARE - Hydrologist (Medical): No    Lack of Transportation (Non-Medical): No  Physical Activity: Inactive (12/23/2021)   Exercise Vital Sign    Days of Exercise per Week: 0 days    Minutes of Exercise per Session: 0 min  Stress: No Stress Concern Present (12/23/2021)   [place]    Feeling of Stress : Not at all  Social Connections: [place] (12/23/2021)   Social Connection and Isolation Panel [NHANES]    Frequency of Communication with Friends and Family: More than three times a week    Frequency of Social Gatherings with Friends and Family: More than three times a week    Attends Religious Services: More than 4 times per year    Active  Member of Clubs or Organizations: Yes    Attends Archivist Meetings: More than 4 times per year    Marital Status: Married  Human resources officer Violence: Not At Risk (12/23/2021)   Humiliation, Afraid, Rape, and Kick questionnaire    Fear of Current or Ex-Partner: No    Emotionally Abused: No    Physically Abused: No    Sexually Abused: No    Past Surgical History:  Procedure Laterality Date   GOLD SEED IMPLANT N/A 12/29/2021   Procedure: GOLD SEED IMPLANT;  Surgeon: Lucas Mallow, MD;  Location: [facility];  Service: Urology;  Laterality: N/A;  [place]  11/2003   TUNA procedure by dr Durwin Reges   PROSTATECTOMY  04/22/2004   '@[facility]'$  by dr Nichola Sizer;  OPEN SIMPLE  PROSTATECTOMY FOR BPH WITH CHRONIC URINARY RETENTION   SPACE OAR INSTILLATION N/A 12/29/2021   Procedure: SPACE OAR INSTILLATION;  Surgeon: Lucas Mallow, MD;  Location: [facility];  Service: Urology;  Laterality: N/A;    Family History  Problem Relation Age of Onset   Hypertension Mother        parent   Hypertension Maternal Grandfather        grandparent    Colon cancer Neg Hx    Colon polyps Neg Hx    Esophageal cancer Neg Hx    Rectal cancer Neg Hx    Stomach cancer Neg Hx     No Known Allergies  Current Outpatient Medications on File Prior to Visit  Medication Sig Dispense Refill   amLODipine (NORVASC) 5 MG tablet TAKE 1 TABLET(5 MG) BY MOUTH DAILY 90 tablet 3   lisinopril-hydrochlorothiazide (ZESTORETIC) 20-25 MG tablet Take 1 tablet by mouth daily. (Patient taking differently: Take 1 tablet by mouth daily.) 90 tablet 3   silodosin (RAPAFLO) 8 MG CAPS capsule Take 8 mg by mouth daily with breakfast.     simvastatin (ZOCOR) 20 MG tablet Take 1 tablet (20 mg total) by mouth daily. (Patient taking differently: Take 20 mg by mouth daily.) 90 tablet 3   No current facility-administered medications on file prior to visit.    BP 130/80   Pulse (!) 56   Temp 98.3 F (36.8 C) (Oral)   Ht '5\' 4"'$  (1.626 m)   Wt 199 lb (90.3 kg)   SpO2 97%   BMI 34.16 kg/m       Objective:   Physical Exam Vitals and nursing note reviewed.  Constitutional:      Appearance: Normal appearance.  Cardiovascular:     Rate and Rhythm: Normal rate and regular rhythm.     Pulses: Normal pulses.     Heart sounds: Normal heart sounds.  Pulmonary:     Effort: Pulmonary effort is normal.     Breath sounds: Normal breath sounds.  Skin:    General: Skin is warm and dry.  Neurological:     General: No focal deficit present.     Mental Status: He is alert and oriented to person, place, and time.  Psychiatric:        Mood and Affect: Mood normal.        Behavior: Behavior  normal.        Thought Content: Thought content normal.       Assessment & Plan:  1. Primary osteoarthritis of both hands - Seems to be arthritic pain  - keep using voltaren and can add Aleve - Try and keep hands  warm and encouraged movement  - Follow up as needed  Dorothyann Peng, NP

## 2022-11-10 ENCOUNTER — Other Ambulatory Visit: Payer: Self-pay | Admitting: Adult Health

## 2022-11-10 DIAGNOSIS — C775 Secondary and unspecified malignant neoplasm of intrapelvic lymph nodes: Secondary | ICD-10-CM | POA: Diagnosis not present

## 2022-11-10 DIAGNOSIS — I10 Essential (primary) hypertension: Secondary | ICD-10-CM

## 2022-11-22 ENCOUNTER — Encounter: Payer: Self-pay | Admitting: Adult Health

## 2022-11-22 ENCOUNTER — Ambulatory Visit (INDEPENDENT_AMBULATORY_CARE_PROVIDER_SITE_OTHER): Payer: Medicare Other | Admitting: Adult Health

## 2022-11-22 VITALS — BP 130/80 | HR 59 | Temp 98.1°F | Ht 64.0 in | Wt 200.0 lb

## 2022-11-22 DIAGNOSIS — E669 Obesity, unspecified: Secondary | ICD-10-CM | POA: Diagnosis not present

## 2022-11-22 DIAGNOSIS — Z0000 Encounter for general adult medical examination without abnormal findings: Secondary | ICD-10-CM

## 2022-11-22 DIAGNOSIS — I10 Essential (primary) hypertension: Secondary | ICD-10-CM

## 2022-11-22 DIAGNOSIS — C61 Malignant neoplasm of prostate: Secondary | ICD-10-CM

## 2022-11-22 DIAGNOSIS — E66811 Obesity, class 1: Secondary | ICD-10-CM

## 2022-11-22 DIAGNOSIS — E782 Mixed hyperlipidemia: Secondary | ICD-10-CM

## 2022-11-22 LAB — HEMOGLOBIN A1C: Hgb A1c MFr Bld: 6.2 % (ref 4.6–6.5)

## 2022-11-22 LAB — COMPREHENSIVE METABOLIC PANEL
ALT: 16 U/L (ref 0–53)
AST: 15 U/L (ref 0–37)
Albumin: 4.3 g/dL (ref 3.5–5.2)
Alkaline Phosphatase: 96 U/L (ref 39–117)
BUN: 16 mg/dL (ref 6–23)
CO2: 29 mEq/L (ref 19–32)
Calcium: 9.5 mg/dL (ref 8.4–10.5)
Chloride: 102 mEq/L (ref 96–112)
Creatinine, Ser: 0.86 mg/dL (ref 0.40–1.50)
GFR: 86.38 mL/min (ref >60.00)
Glucose, Bld: 95 mg/dL (ref 70–99)
Potassium: 3.7 mEq/L (ref 3.5–5.1)
Sodium: 139 mEq/L (ref 135–145)
Total Bilirubin: 0.5 mg/dL (ref 0.2–1.2)
Total Protein: 7 g/dL (ref 6.0–8.3)

## 2022-11-22 LAB — CBC
HCT: 36.5 % — ABNORMAL LOW (ref 39.0–52.0)
Hemoglobin: 12.2 g/dL — ABNORMAL LOW (ref 13.0–17.0)
MCHC: 33.5 g/dL (ref 30.0–36.0)
MCV: 85.5 fl (ref 78.0–100.0)
Platelets: 194 10*3/uL (ref 150.0–400.0)
RBC: 4.26 Mil/uL (ref 4.22–5.81)
RDW: 14.4 % (ref 11.5–15.5)
WBC: 4.1 10*3/uL (ref 4.0–10.5)

## 2022-11-22 LAB — LIPID PANEL
Cholesterol: 136 mg/dL (ref 0–200)
HDL: 50.5 mg/dL (ref >39.00)
LDL Cholesterol: 73 mg/dL (ref 0–99)
NonHDL: 85.24
Total CHOL/HDL Ratio: 3
Triglycerides: 59 mg/dL (ref 0.0–149.0)
VLDL: 11.8 mg/dL (ref 0.0–40.0)

## 2022-11-22 NOTE — Progress Notes (Signed)
Subjective:    Patient ID: John Stone, male    DOB: [birth year], 72 y.o.   MRN: [id number]  HPI Patient presents for yearly preventative medicine examination. 72 year old male who  has a past medical history of Arthritis, BPH with obstruction/lower urinary tract symptoms, History of chronic prostatitis (2005), History of urinary retention, Hyperlipemia, mixed, Hypertension, Metastatic castration-sensitive adenocarcinoma of prostate (08/2021), Pre-diabetes, and Wears dentures.  Hypertension-takes lisinopril/HCTZ 20-25 mg daily and Norvasc 5 mg daily.  He denies dizziness, lightheadedness, chest pain, or shortness of breath.  BP Readings from Last 3 Encounters:  11/22/22 130/80  10/14/22 130/80  07/28/22 122/80   Hyperlipidemia-prescribed Zocor 10 mg daily.  He denies myalgia or fatigue Lab Results  Component Value Date   CHOL 173 11/16/2021   HDL 52.40 11/16/2021   LDLCALC 111 (H) 11/16/2021   TRIG 47.0 11/16/2021   CHOLHDL 3 11/16/2021    Prostate Cancer -has had history of elevated PSA in the past with negative biopsies.  December 2022 and elevated PSA up to 60.69.  He underwent transrectal ultrasound and prostate biopsy on 08/27/2021 which showed 4 out of 12 cores positive for adenocarcinoma of the prostate.  He underwent a PET scan and fortunately shows no evidence of metastatic disease to the pelvic lymph nodes.  He was then treated with androgen deprivation therapy and pelvic radiation.  Then started 6 months of Eligard.  In May 2023 he had seed placement done. He was last seen by Urology a week ago and PSA was undetectable.  Prediabetes - not currently taking any medication. Tries to eat healthy and stays active.   Lab Results  Component Value Date   HGBA1C 5.7 05/17/2022    Repeat PET in 2/203 showed IMPRESSION: 1. Locally advanced, markedly tracer avid primary with pelvic nodal metastasis. 2. No tracer avid extrapelvic metastatic disease   All immunizations  and health maintenance protocols were reviewed with the patient and needed orders were placed.  Appropriate screening laboratory values were ordered for the patient including screening of hyperlipidemia, renal function and hepatic function. If indicated by BPH, a PSA was ordered.  Medication reconciliation,  past medical history, social history, problem list and allergies were reviewed in detail with the patient  Goals were established with regard to weight loss, exercise, and  diet in compliance with medications Wt Readings from Last 3 Encounters:  11/22/22 200 lb (90.7 kg)  10/14/22 199 lb (90.3 kg)  07/28/22 189 lb (85.7 kg)   He is up to date on routine colon cancer screening.   He has no acute issues today   Review of Systems  Constitutional: Negative.   HENT: Negative.    Eyes: Negative.   Respiratory: Negative.    Cardiovascular: Negative.   Gastrointestinal: Negative.   Endocrine: Negative.   Genitourinary: Negative.   Musculoskeletal: Negative.   Skin: Negative.   Allergic/Immunologic: Negative.   Neurological: Negative.   Hematological: Negative.   Psychiatric/Behavioral: Negative.    All other systems reviewed and are negative.  Past Medical History:  Diagnosis Date   Arthritis    BPH with obstruction/lower urinary tract symptoms    History of chronic prostatitis 2005   post op prostate bx's   History of urinary retention    Hyperlipemia, mixed    Hypertension    Metastatic castration-sensitive adenocarcinoma of prostate 08/2021   urologist--- dr newsome/  oncologist--- dr Clelia Croft;  dx 01/ 2023,  gleason 8, PSA 60.7,  w/ pelvic adenopathy include left para-aortic  lymph node   Pre-diabetes    Wears dentures    upper    Social History   Socioeconomic History   Marital status: Married    Spouse name: Not on file   Number of children: Not on file   Years of education: Not on file   Highest education level: Not on file  Occupational History   Not on  file  Tobacco Use   Smoking status: Never   Smokeless tobacco: Never  Vaping Use   Vaping Use: Never used  Substance and Sexual Activity   Alcohol use: No   Drug use: No   Sexual activity: Not on file  Other Topics Concern   Not on file  Social History Narrative   Married   8 kids    6 live locally.       Work in [employer]   He likes to stay active in [employer].    Social Determinants of Health   Financial Resource Strain: Low Risk  (12/23/2021)   Overall Financial Resource Strain (CARDIA)    Difficulty of Paying Living Expenses: Not hard at all  Food Insecurity: No Food Insecurity (12/23/2021)   Hunger Vital Sign    Worried About Running Out of Food in the Last Year: Never true    Ran Out of Food in the Last Year: Never true  Transportation Needs: No Transportation Needs (12/23/2021)   PRAPARE - Administrator, Civil ServiceTransportation    Lack of Transportation (Medical): No    Lack of Transportation (Non-Medical): No  Physical Activity: Inactive (12/23/2021)   Exercise Vital Sign    Days of Exercise per Week: 0 days    Minutes of Exercise per Session: 0 min  Stress: No Stress Concern Present (12/23/2021)   [employer]ational [employer] - Occupational Stress Questionnaire    Feeling of Stress : Not at all  Social Connections: Socially Integrated (12/23/2021)   Social Connection and Isolation Panel [NHANES]    Frequency of Communication with Friends and Family: More than three times a week    Frequency of Social Gatherings with Friends and Family: More than three times a week    Attends Religious Services: More than 4 times per year    Active Member of Clubs or Organizations: Yes    Attends BankerClub or Organization Meetings: More than 4 times per year    Marital Status: Married  Catering managerntimate Partner Violence: Not At Risk (12/23/2021)   Humiliation, Afraid, Rape, and Kick questionnaire    Fear of Current or Ex-Partner: No    Emotionally Abused: No    Physically Abused: No    Sexually  Abused: No    Past Surgical History:  Procedure Laterality Date   GOLD SEED IMPLANT N/A 12/29/2021   Procedure: GOLD SEED IMPLANT;  Surgeon: Crista ElliotBell, Eugene D III, MD;  [facility];  Service: Urology;  Laterality: N/A;  30 MINS   PROSTATE SURGERY  11/2003   TUNA procedure by dr Franchot Erichsentannabaum   PROSTATECTOMY  04/22/2004   @[facility]  by dr Volney Americantannanbaum;  OPEN SIMPLE PROSTATECTOMY FOR BPH WITH CHRONIC URINARY RETENTION   SPACE OAR INSTILLATION N/A 12/29/2021   Procedure: SPACE OAR INSTILLATION;  Surgeon: Crista ElliotBell, Eugene D III, MD;  [facility];  Service: Urology;  Laterality: N/A;    Family History  Problem Relation Age of Onset   Hypertension Mother        parent   Hypertension Maternal Grandfather        grandparent  Colon cancer Neg Hx    Colon polyps Neg Hx    Esophageal cancer Neg Hx    Rectal cancer Neg Hx    Stomach cancer Neg Hx     No Known Allergies  Current Outpatient Medications on File Prior to Visit  Medication Sig Dispense Refill   amLODipine (NORVASC) 5 MG tablet TAKE 1 TABLET(5 MG) BY MOUTH DAILY 90 tablet 3   lisinopril-hydrochlorothiazide (ZESTORETIC) 20-25 MG tablet TAKE 1 TABLET BY MOUTH DAILY 90 tablet 3   mirabegron ER (MYRBETRIQ) 25 MG TB24 tablet Take 25 mg by mouth daily.     silodosin (RAPAFLO) 8 MG CAPS capsule Take 8 mg by mouth daily with breakfast.     simvastatin (ZOCOR) 20 MG tablet Take 1 tablet (20 mg total) by mouth daily. (Patient taking differently: Take 20 mg by mouth daily.) 90 tablet 3   No current facility-administered medications on file prior to visit.    BP 130/80   Pulse (!) 59   Temp 98.1 F (36.7 C) (Oral)   Ht  (1.626 m)   Wt 200 lb (90.7 kg)   SpO2 96%   BMI 34.33 kg/m       Objective:   Physical Exam Vitals and nursing note reviewed.  Constitutional:      General: He is not in acute distress.    Appearance: Normal appearance. He is obese. He is not ill-appearing.  HENT:      Head: Normocephalic and atraumatic.     Right Ear: Tympanic membrane, ear canal and external ear normal. There is no impacted cerumen.     Left Ear: Tympanic membrane, ear canal and external ear normal. There is no impacted cerumen.     Nose: Nose normal. No congestion or rhinorrhea.     Mouth/Throat:     Mouth: Mucous membranes are moist.     Pharynx: Oropharynx is clear.  Eyes:     Extraocular Movements: Extraocular movements intact.     Conjunctiva/sclera: Conjunctivae normal.     Pupils: Pupils are equal, round, and reactive to light.  Neck:     Vascular: No carotid bruit.  Cardiovascular:     Rate and Rhythm: Normal rate and regular rhythm.     Pulses: Normal pulses.     Heart sounds: No murmur heard.    No friction rub. No gallop.  Pulmonary:     Effort: Pulmonary effort is normal.     Breath sounds: Normal breath sounds.  Abdominal:     General: Abdomen is flat. Bowel sounds are normal. There is no distension.     Palpations: Abdomen is soft. There is no mass.     Tenderness: There is no abdominal tenderness. There is no guarding or rebound.     Hernia: No hernia is present.  Musculoskeletal:        General: Normal range of motion.     Cervical back: Normal range of motion and neck supple.  Lymphadenopathy:     Cervical: No cervical adenopathy.  Skin:    General: Skin is warm and dry.     Capillary Refill: Capillary refill takes less than 2 seconds.  Neurological:     General: No focal deficit present.     Mental Status: He is alert and oriented to person, place, and time.  Psychiatric:        Mood and Affect: Mood normal.        Behavior: Behavior normal.        Thought  Content: Thought content normal.        Judgment: Judgment normal.       Assessment & Plan:  1. Routine general medical examination at a health care facility Today patient counseled on age appropriate routine health concerns for screening and prevention, each reviewed and up to date or declined.  Immunizations reviewed and up to date or declined. Labs ordered and reviewed. Risk factors for depression reviewed and negative. Hearing function and visual acuity are intact. ADLs screened and addressed as needed. Functional ability and level of safety reviewed and appropriate. Education, counseling and referrals performed based on assessed risks today. Patient provided with a copy of personalized plan for preventive services. - Advised shingles and Tdap at pharmacy  - Work on weight loss  - Follow up in one year or PRN   2. Essential hypertension - Well controlled - No change in medication  - Lipid panel; Future - CBC; Future - Comprehensive metabolic panel; Future  3. Mixed hyperlipidemia - Consider increase in statin  - Lipid panel; Future - CBC; Future - Comprehensive metabolic panel; Future  4. Prostate cancer - Per urology   5. Class 1 obesity - Encouraged lifestyle modifications  - Lipid panel; Future - CBC; Future - Comprehensive metabolic panel; Future - Hemoglobin A1c; Future  Shirline Frees, NP

## 2022-11-22 NOTE — Patient Instructions (Signed)
It was great seeing you today   We will follow up with you regarding your lab work   Please let me know if you need anything   

## 2022-12-19 ENCOUNTER — Telehealth: Payer: Self-pay | Admitting: Adult Health

## 2022-12-19 NOTE — Telephone Encounter (Signed)
Contacted John Stone to schedule their annual wellness visit. Appointment made for 12/28/22.  [name] AWV direct phone # [phone]  Due to schedule change 12/26/22 awv r/s to 5/15 for in office appt  Pt aware of appt date /time chagne

## 2022-12-28 ENCOUNTER — Ambulatory Visit (INDEPENDENT_AMBULATORY_CARE_PROVIDER_SITE_OTHER): Payer: Medicare Other

## 2022-12-28 VITALS — BP 118/64 | HR 60 | Temp 98.5°F | Ht 64.0 in | Wt 200.3 lb

## 2022-12-28 DIAGNOSIS — Z0000 Encounter for general adult medical examination without abnormal findings: Secondary | ICD-10-CM | POA: Diagnosis not present

## 2022-12-28 NOTE — Progress Notes (Signed)
Subjective:   John Stone is a 72 y.o. male who presents for Medicare Annual/Subsequent preventive examination.  Review of Systems     Cardiac Risk Factors include: advanced age (>33men, >75 women);male gender;hypertension     Objective:    Today's Vitals   12/28/22 0814  BP: 118/64  Pulse: 60  Temp: 98.5 F (36.9 C)  TempSrc: Oral  SpO2: 96%  Weight: 200 lb 4.8 oz (90.9 kg)  Height: 5\' 4"  (1.626 m)   Body mass index is 34.38 kg/m.     12/28/2022    8:35 AM 05/10/2022    2:34 PM 12/29/2021    7:57 AM 12/23/2021    8:37 AM 09/30/2020    9:27 AM 10/11/2017   10:18 AM 06/27/2016    3:50 PM  Advanced Directives  Does Patient Have a Medical Advance Directive? No No No No No No No  Would patient like information on creating a medical advance directive? No - Patient declined  No - Patient declined No - Patient declined No - Patient declined  No - patient declined information    Current Medications (verified) Outpatient Encounter Medications as of 12/28/2022  Medication Sig   amLODipine (NORVASC) 5 MG tablet TAKE 1 TABLET(5 MG) BY MOUTH DAILY   lisinopril-hydrochlorothiazide (ZESTORETIC) 20-25 MG tablet TAKE 1 TABLET BY MOUTH DAILY   mirabegron ER (MYRBETRIQ) 25 MG TB24 tablet Take 25 mg by mouth daily.   silodosin (RAPAFLO) 8 MG CAPS capsule Take 8 mg by mouth daily with breakfast.   simvastatin (ZOCOR) 20 MG tablet Take 1 tablet (20 mg total) by mouth daily. (Patient taking differently: Take 20 mg by mouth daily.)   No facility-administered encounter medications on file as of 12/28/2022.    Allergies (verified) Patient has no known allergies.   History: Past Medical History:  Diagnosis Date   Arthritis    BPH with obstruction/lower urinary tract symptoms    History of chronic prostatitis 2005   post op prostate bx's   History of urinary retention    Hyperlipemia, mixed    Hypertension    Metastatic castration-sensitive adenocarcinoma of prostate (HCC)  08/2021   urologist--- dr newsome/  oncologist--- dr Clelia Croft;  dx 01/ 2023,  gleason 8, PSA 60.7,  w/ pelvic adenopathy include left para-aortic lymph node   Pre-diabetes    Wears dentures    upper   Past Surgical History:  Procedure Laterality Date   GOLD SEED IMPLANT N/A 12/29/2021   Procedure: GOLD SEED IMPLANT;  Surgeon: Crista Elliot, MD;  Location: [facility];  Service: Urology;  Laterality: N/A;  30 MINS   PROSTATE SURGERY  11/2003   TUNA procedure by dr Franchot Erichsen   PROSTATECTOMY  04/22/2004   @[facility]  by dr Volney American;  OPEN SIMPLE PROSTATECTOMY FOR BPH WITH CHRONIC URINARY RETENTION   SPACE OAR INSTILLATION N/A 12/29/2021   Procedure: SPACE OAR INSTILLATION;  Surgeon: Crista Elliot, MD;  [facility];  Service: Urology;  Laterality: N/A;   Family History  Problem Relation Age of Onset   Hypertension Mother        parent   Hypertension Maternal Grandfather        grandparent    Colon cancer Neg Hx    Colon polyps Neg Hx    Esophageal cancer Neg Hx    Rectal cancer Neg Hx    Stomach cancer Neg Hx    Social History   Socioeconomic History   Marital status:  Married    Spouse name: Not on file   Number of children: Not on file   Years of education: Not on file   Highest education level: Not on file  Occupational History   Not on file  Tobacco Use   Smoking status: Never   Smokeless tobacco: Never  Vaping Use   Vaping Use: Never used  Substance and Sexual Activity   Alcohol use: No   Drug use: No   Sexual activity: Not on file  Other Topics Concern   Not on file  Social History Narrative   Married   8 kids    6 live locally.       Work in [employer]   He likes to stay active in [employer].    Social Determinants of Health   Financial Resource Strain: Low Risk  (12/28/2022)   Overall Financial Resource Strain (CARDIA)    Difficulty of Paying Living Expenses: Not hard at all  Food Insecurity: No Food  Insecurity (12/28/2022)   Hunger Vital Sign    Worried About Running Out of Food in the Last Year: Never true    Ran Out of Food in the Last Year: Never true  Transportation Needs: No Transportation Needs (12/28/2022)   PRAPARE - Administrator, Civil Service (Medical): No    Lack of Transportation (Non-Medical): No  Physical Activity: Insufficiently Active (12/28/2022)   Exercise Vital Sign    Days of Exercise per Week: 3 days    Minutes of Exercise per Session: 10 min  Stress: No Stress Concern Present (12/28/2022)   [employer] of Occupational Health - Occupational Stress Questionnaire    Feeling of Stress : Not at all  Social Connections: Socially Integrated (12/28/2022)   Social Connection and Isolation Panel [NHANES]    Frequency of Communication with Friends and Family: More than three times a week    Frequency of Social Gatherings with Friends and Family: More than three times a week    Attends Religious Services: More than 4 times per year    Active Member of [employer] or Organizations: Yes    Attends Engineer, structural: More than 4 times per year    Marital Status: Married    Tobacco Counseling Counseling given: Not Answered   Clinical Intake:  Pre-visit preparation completed: No  Pain : No/denies pain     BMI - recorded: 34.38 Nutritional Status: BMI > 30  Obese Nutritional Risks: None Diabetes: No  How often do you need to have someone help you when you read instructions, pamphlets, or other written materials from your doctor or pharmacy?: 1 - Never  Diabetic?  No  Interpreter Needed?: No  Information entered by :: Theresa Mulligan LPN   Activities of Daily Living    12/28/2022    8:33 AM 12/29/2021    8:02 AM  In your present state of health, do you have any difficulty performing the following activities:  Hearing? 0 0  Vision? 0 0  Difficulty concentrating or making decisions? 0 0  Walking or climbing stairs? 0 0  Dressing or  bathing? 0 0  Doing errands, shopping? 0   Preparing Food and eating ? N   Using the Toilet? N   In the past six months, have you accidently leaked urine? N   Do you have problems with loss of bowel control? N   Managing your Medications? N   Managing your Finances? N   Housekeeping or managing your Housekeeping?  N     Patient Care Team: Shirline Frees, NP as PCP - General (Family Medicine) Ihor Gully, MD (Inactive) as Consulting Physician (Urology) [facility] (Specialist)  Indicate any recent Medical Services you may have received from other than [facility] providers in the past year (date may be approximate).     Assessment:   This is a routine wellness examination for [place].  Hearing/Vision screen Hearing Screening - Comments:: Denies hearing difficulties   Vision Screening - Comments:: Wears reading glasses - Not up to date with routine eye exams. Patient deferred    Dietary issues and exercise activities discussed: Current Exercise Habits: Home exercise routine, Type of exercise: walking;treadmill, Time (Minutes): 10, Frequency (Times/Week): 3, Weekly Exercise (Minutes/Week): 30, Intensity: Moderate, Exercise limited by: None identified   Goals Addressed               This Visit's Progress     Patient stated (pt-stated)        Make a whole lot of money!       Depression Screen    12/28/2022    8:32 AM 11/22/2022    8:06 AM 07/28/2022    2:49 PM 05/17/2022   11:40 AM 12/23/2021    8:30 AM 11/11/2021    7:34 AM 09/30/2020    9:29 AM  PHQ 2/9 Scores  PHQ - 2 Score 0 0 0 0 0 1 0  PHQ- 9 Score 0 0 2 3 0 2     Fall Risk    12/28/2022    8:34 AM 11/22/2022    8:06 AM 05/17/2022   11:39 AM 12/23/2021    8:35 AM 09/30/2020    9:28 AM  Fall Risk   Falls in the past year? 0 0 1 1 0  Number falls in past yr: 0 0 1 0 0  Injury with Fall? 0 0 0 0 0  Comment    No injury or medical attention needed   Risk for fall due to : No Fall Risks No Fall Risks Other (Comment)  No Fall Risks No Fall Risks  Follow up Falls prevention discussed Falls evaluation completed Falls evaluation completed  Falls evaluation completed;Falls prevention discussed    FALL RISK PREVENTION PERTAINING TO THE HOME:  Any stairs in or around the home? Yes  If so, are there any without handrails? No  Home free of loose throw rugs in walkways, pet beds, electrical cords, etc? Yes  Adequate lighting in your home to reduce risk of falls? Yes   ASSISTIVE DEVICES UTILIZED TO PREVENT FALLS:  Life alert? No  Use of a cane, walker or w/c? No  Grab bars in the bathroom? No  Shower chair or bench in shower? No  Elevated toilet seat or a handicapped toilet? No   TIMED UP AND GO:  Was the test performed? Yes .  Length of time to ambulate 10 feet: 10 sec.   Gait steady and fast without use of assistive device  Cognitive Function:        12/28/2022    8:35 AM 12/23/2021    8:37 AM  6CIT Screen  What Year? 0 points 0 points  What month? 0 points 0 points  What time? 0 points 0 points  Count back from 20 0 points 0 points  Months in reverse 0 points 0 points  Repeat phrase 0 points 0 points  Total Score 0 points 0 points    Immunizations Immunization History  Administered Date(s) Administered   Fluad  Quad(high Dose 65+) 05/07/2019, 06/09/2020, 05/26/2021, 05/17/2022   Influenza, High Dose Seasonal PF 09/28/2017, 06/12/2018   PFIZER(Purple Top)SARS-COV-2 Vaccination 10/24/2019, 11/18/2019   Pneumococcal Conjugate-13 10/16/2018   Pneumococcal Polysaccharide-23 06/09/2020    TDAP status: Due, Education has been provided regarding the importance of this vaccine. Advised may receive this vaccine at local pharmacy or Health Dept. Aware to provide a copy of the vaccination record if obtained from local pharmacy or Health Dept. Verbalized acceptance and understanding.  Flu Vaccine status: Up to date  Pneumococcal vaccine status: Up to date  Covid-19 vaccine status: Completed  vaccines  Qualifies for Shingles Vaccine? Yes   Zostavax completed No   Shingrix Completed?: No.    Education has been provided regarding the importance of this vaccine. Patient has been advised to call insurance company to determine out of pocket expense if they have not yet received this vaccine. Advised may also receive vaccine at local pharmacy or Health Dept. Verbalized acceptance and understanding.  Screening Tests Health Maintenance  Topic Date Due   DTaP/Tdap/Td (1 - Tdap) Never done   COVID-19 Vaccine (3 - Pfizer risk series) 01/13/2023 (Originally 12/16/2019)   Zoster Vaccines- Shingrix (1 of 2) 03/30/2023 (Originally 03/07/1970)   INFLUENZA VACCINE  03/16/2023   Fecal DNA (Cologuard)  11/25/2023   Medicare Annual Wellness (AWV)  12/28/2023   Pneumonia Vaccine 40+ Years old  Completed   Hepatitis C Screening  Completed   HPV VACCINES  Aged Out    Health Maintenance  Health Maintenance Due  Topic Date Due   DTaP/Tdap/Td (1 - Tdap) Never done    Colorectal cancer screening: Type of screening: Cologuard. Completed 11/24/20. Repeat every 11/24/20 years  Lung Cancer Screening: (Low Dose CT Chest recommended if Age 37-80 years, 30 pack-year currently smoking OR have quit w/in 15years.) does not qualify.     Additional Screening:  Hepatitis C Screening: does qualify; Completed 2/29/22  Vision Screening: Recommended annual ophthalmology exams for early detection of glaucoma and other disorders of the eye. Is the patient up to date with their annual eye exam?  No  Who is the provider or what is the name of the office in which the patient attends annual eye exams? Deferred If pt is not established with a provider, would they like to be referred to a provider to establish care? No .   Dental Screening: Recommended annual dental exams for proper oral hygiene  Community Resource Referral / Chronic Care Management:  CRR required this visit?  No   CCM required this visit?  No       Plan:     I have personally reviewed and noted the following in the patient's chart:   Medical and social history Use of alcohol, tobacco or illicit drugs  Current medications and supplements including opioid prescriptions. Patient is not currently taking opioid prescriptions. Functional ability and status Nutritional status Physical activity Advanced directives List of other physicians Hospitalizations, surgeries, and ER visits in previous 12 months Vitals Screenings to include cognitive, depression, and falls Referrals and appointments  In addition, I have reviewed and discussed with patient certain preventive protocols, quality metrics, and best practice recommendations. A written personalized care plan for preventive services as well as general preventive health recommendations were provided to patient.     Tillie Rung, LPN   01/17/5408   Nurse Notes: None

## 2022-12-28 NOTE — Patient Instructions (Addendum)
[name] , Thank you for taking time to come for your Medicare Wellness Visit. I appreciate your ongoing commitment to your health goals. Please review the following plan we discussed and let me know if I can assist you in the future.   These are the goals we discussed:  Goals       Patient Stated      Patient plans to keep working      Patient stated (pt-stated)      I want to make more money and get into real estate.      Patient stated (pt-stated)      Make a whole lot of money!        This is a list of the screening recommended for you and due dates:  Health Maintenance  Topic Date Due   DTaP/Tdap/Td vaccine (1 - Tdap) Never done   COVID-19 Vaccine (3 - Pfizer risk series) 01/13/2023*   Zoster (Shingles) Vaccine (1 of 2) 03/30/2023*   Flu Shot  03/16/2023   Cologuard (Stool DNA test)  11/25/2023   Medicare Annual Wellness Visit  12/28/2023   Pneumonia Vaccine  Completed   Hepatitis C Screening: USPSTF Recommendation to screen - Ages 18-79 yo.  Completed   HPV Vaccine  Aged Out  *Topic was postponed. The date shown is not the original due date.    Advanced directives: Advance directive discussed with you today. Even though you declined this today, please call our office should you change your mind, and we can give you the proper paperwork for you to fill out.   Conditions/risks identified: None  Next appointment: Follow up in one year for your annual wellness visit.   Preventive Care 33 Years and Older, Male  Preventive care refers to lifestyle choices and visits with your health care provider that can promote health and wellness. What does preventive care include? A yearly physical exam. This is also called an annual well check. Dental exams once or twice a year. Routine eye exams. Ask your health care provider how often you should have your eyes checked. Personal lifestyle choices, including: Daily care of your teeth and gums. Regular physical activity. Eating a  healthy diet. Avoiding tobacco and drug use. Limiting alcohol use. Practicing safe sex. Taking low doses of aspirin every day. Taking vitamin and mineral supplements as recommended by your health care provider. What happens during an annual well check? The services and screenings done by your health care provider during your annual well check will depend on your age, overall health, lifestyle risk factors, and family history of disease. Counseling  Your health care provider may ask you questions about your: Alcohol use. Tobacco use. Drug use. Emotional well-being. Home and relationship well-being. Sexual activity. Eating habits. History of falls. Memory and ability to understand (cognition). Work and work Astronomer. Screening  You may have the following tests or measurements: Height, weight, and BMI. Blood pressure. Lipid and cholesterol levels. These may be checked every 5 years, or more frequently if you are over 57 years old. Skin check. Lung cancer screening. You may have this screening every year starting at age 42 if you have a 30-pack-year history of smoking and currently smoke or have quit within the past 15 years. Fecal occult blood test (FOBT) of the stool. You may have this test every year starting at age 49. Flexible sigmoidoscopy or colonoscopy. You may have a sigmoidoscopy every 5 years or a colonoscopy every 10 years starting at age 82. Prostate cancer  screening. Recommendations will vary depending on your family history and other risks. Hepatitis C blood test. Hepatitis B blood test. Sexually transmitted disease (STD) testing. Diabetes screening. This is done by checking your blood sugar (glucose) after you have not eaten for a while (fasting). You may have this done every 1-3 years. Abdominal aortic aneurysm (AAA) screening. You may need this if you are a current or former smoker. Osteoporosis. You may be screened starting at age 31 if you are at high risk. Talk  with your health care provider about your test results, treatment options, and if necessary, the need for more tests. Vaccines  Your health care provider may recommend certain vaccines, such as: Influenza vaccine. This is recommended every year. Tetanus, diphtheria, and acellular pertussis (Tdap, Td) vaccine. You may need a Td booster every 10 years. Zoster vaccine. You may need this after age 46. Pneumococcal 13-valent conjugate (PCV13) vaccine. One dose is recommended after age 56. Pneumococcal polysaccharide (PPSV23) vaccine. One dose is recommended after age 22. Talk to your health care provider about which screenings and vaccines you need and how often you need them. This information is not intended to replace advice given to you by your health care provider. Make sure you discuss any questions you have with your health care provider. Document Released: 08/28/2015 Document Revised: 04/20/2016 Document Reviewed: 06/02/2015 Elsevier Interactive Patient Education  2017 [place] Prevention in the Home Falls can cause injuries. They can happen to people of all ages. There are many things you can do to make your home safe and to help prevent falls. What can I do on the outside of my home? Regularly fix the edges of walkways and driveways and fix any cracks. Remove anything that might make you trip as you walk through a door, such as a raised step or threshold. Trim any bushes or trees on the path to your home. Use bright outdoor lighting. Clear any walking paths of anything that might make someone trip, such as rocks or tools. Regularly check to see if handrails are loose or broken. Make sure that both sides of any steps have handrails. Any raised decks and porches should have guardrails on the edges. Have any leaves, snow, or ice cleared regularly. Use sand or salt on walking paths during winter. Clean up any spills in your garage right away. This includes oil or grease  spills. What can I do in the bathroom? Use night lights. Install grab bars by the toilet and in the tub and shower. Do not use towel bars as grab bars. Use non-skid mats or decals in the tub or shower. If you need to sit down in the shower, use a plastic, non-slip stool. Keep the floor dry. Clean up any water that spills on the floor as soon as it happens. Remove soap buildup in the tub or shower regularly. Attach bath mats securely with double-sided non-slip rug tape. Do not have throw rugs and other things on the floor that can make you trip. What can I do in the bedroom? Use night lights. Make sure that you have a light by your bed that is easy to reach. Do not use any sheets or blankets that are too big for your bed. They should not hang down onto the floor. Have a firm chair that has side arms. You can use this for support while you get dressed. Do not have throw rugs and other things on the floor that can make you trip. What can  I do in the kitchen? Clean up any spills right away. Avoid walking on wet floors. Keep items that you use a lot in easy-to-reach places. If you need to reach something above you, use a strong step stool that has a grab bar. Keep electrical cords out of the way. Do not use floor polish or wax that makes floors slippery. If you must use wax, use non-skid floor wax. Do not have throw rugs and other things on the floor that can make you trip. What can I do with my stairs? Do not leave any items on the stairs. Make sure that there are handrails on both sides of the stairs and use them. Fix handrails that are broken or loose. Make sure that handrails are as long as the stairways. Check any carpeting to make sure that it is firmly attached to the stairs. Fix any carpet that is loose or worn. Avoid having throw rugs at the top or bottom of the stairs. If you do have throw rugs, attach them to the floor with carpet tape. Make sure that you have a light switch at the  top of the stairs and the bottom of the stairs. If you do not have them, ask someone to add them for you. What else can I do to help prevent falls? Wear shoes that: Do not have high heels. Have rubber bottoms. Are comfortable and fit you well. Are closed at the toe. Do not wear sandals. If you use a stepladder: Make sure that it is fully opened. Do not climb a closed stepladder. Make sure that both sides of the stepladder are locked into place. Ask someone to hold it for you, if possible. Clearly mark and make sure that you can see: Any grab bars or handrails. First and last steps. Where the edge of each step is. Use tools that help you move around (mobility aids) if they are needed. These include: Canes. Walkers. Scooters. Crutches. Turn on the lights when you go into a dark area. Replace any light bulbs as soon as they burn out. Set up your furniture so you have a clear path. Avoid moving your furniture around. If any of your floors are uneven, fix them. If there are any pets around you, be aware of where they are. Review your medicines with your doctor. Some medicines can make you feel dizzy. This can increase your chance of falling. Ask your doctor what other things that you can do to help prevent falls. This information is not intended to replace advice given to you by your health care provider. Make sure you discuss any questions you have with your health care provider. Document Released: 05/28/2009 Document Revised: 01/07/2016 Document Reviewed: 09/05/2014 Elsevier Interactive Patient Education  2017 [employer].

## 2023-01-13 ENCOUNTER — Other Ambulatory Visit: Payer: Self-pay | Admitting: Adult Health

## 2023-01-19 ENCOUNTER — Ambulatory Visit (INDEPENDENT_AMBULATORY_CARE_PROVIDER_SITE_OTHER): Payer: Medicare Other | Admitting: Adult Health

## 2023-01-19 ENCOUNTER — Ambulatory Visit (INDEPENDENT_AMBULATORY_CARE_PROVIDER_SITE_OTHER): Payer: Medicare Other

## 2023-01-19 ENCOUNTER — Encounter: Payer: Self-pay | Admitting: Adult Health

## 2023-01-19 VITALS — BP 140/80 | HR 63 | Temp 98.3°F | Ht 64.0 in | Wt 202.0 lb

## 2023-01-19 DIAGNOSIS — M19041 Primary osteoarthritis, right hand: Secondary | ICD-10-CM | POA: Diagnosis not present

## 2023-01-19 DIAGNOSIS — I10 Essential (primary) hypertension: Secondary | ICD-10-CM | POA: Diagnosis not present

## 2023-01-19 DIAGNOSIS — M19042 Primary osteoarthritis, left hand: Secondary | ICD-10-CM | POA: Diagnosis not present

## 2023-01-19 MED ORDER — MELOXICAM 7.5 MG PO TABS
7.5000 mg | ORAL_TABLET | Freq: Every day | ORAL | 0 refills | Status: DC
Start: 2023-01-19 — End: 2023-02-22

## 2023-01-19 NOTE — Progress Notes (Signed)
Subjective:    Patient ID: John Stone, male    DOB: [birth year], 72 y.o.   MRN: [id number]  HPI 72 year old male who  has a past medical history of Arthritis, BPH with obstruction/lower urinary tract symptoms, History of chronic prostatitis (2005), History of urinary retention, Hyperlipemia, mixed, Hypertension, Metastatic castration-sensitive adenocarcinoma of prostate (HCC) (08/2021), Pre-diabetes, and Wears dentures.  He presents to the office today for continued bilateral hand pain. He was last seen for this issue about three months ago at which time he felt as though the joints in both hands were stiff in the morning. The stiffness improved throughout the day. He did not have any swelling, redness, or warmth. He had just started using Voltaren Gel the day before being seen. He reports that today that the Voltaren Gel did not work very well. He continues to have stiffness and pain in most of his fingers - does improve throughout the day and some days are better than others.  He has  a hard time opening jars due to decreased grip strength. He is interested in seeing a hand specialist.    Review of Systems See HPI   Past Medical History:  Diagnosis Date   Arthritis    BPH with obstruction/lower urinary tract symptoms    History of chronic prostatitis 2005   post op prostate bx's   History of urinary retention    Hyperlipemia, mixed    Hypertension    Metastatic castration-sensitive adenocarcinoma of prostate (HCC) 08/2021   urologist--- dr newsome/  oncologist--- dr Clelia Croft;  dx 01/ 2023,  gleason 8, PSA 60.7,  w/ pelvic adenopathy include left para-aortic lymph node   Pre-diabetes    Wears dentures    upper    Social History   Socioeconomic History   Marital status: Married    Spouse name: Not on file   Number of children: Not on file   Years of education: Not on file   Highest education level: Not on file  Occupational History   Not on file  Tobacco Use   Smoking  status: Never   Smokeless tobacco: Never  Vaping Use   Vaping Use: Never used  Substance and Sexual Activity   Alcohol use: No   Drug use: No   Sexual activity: Not on file  Other Topics Concern   Not on file  Social History Narrative   Married   8 kids    6 live locally.       Work in [employer]   He likes to stay active in [employer].    Social Determinants of Health   Financial Resource Strain: Low Risk  (12/28/2022)   Overall Financial Resource Strain (CARDIA)    Difficulty of Paying Living Expenses: Not hard at all  Food Insecurity: No Food Insecurity (12/28/2022)   Hunger Vital Sign    Worried About Running Out of Food in the Last Year: Never true    Ran Out of Food in the Last Year: Never true  Transportation Needs: No Transportation Needs (12/28/2022)   PRAPARE - Administrator, Civil Service (Medical): No    Lack of Transportation (Non-Medical): No  Physical Activity: Insufficiently Active (12/28/2022)   Exercise Vital Sign    Days of Exercise per Week: 3 days    Minutes of Exercise per Session: 10 min  Stress: No Stress Concern Present (12/28/2022)   [employer] of Occupational Health - Occupational Stress Questionnaire    Feeling  of Stress : Not at all  Social Connections: Socially Integrated (12/28/2022)   Social Connection and Isolation Panel [NHANES]    Frequency of Communication with Friends and Family: More than three times a week    Frequency of Social Gatherings with Friends and Family: More than three times a week    Attends Religious Services: More than 4 times per year    Active Member of Clubs or Organizations: Yes    Attends Banker Meetings: More than 4 times per year    Marital Status: Married  Catering manager Violence: Not At Risk (12/28/2022)   Humiliation, Afraid, Rape, and Kick questionnaire    Fear of Current or Ex-Partner: No    Emotionally Abused: No    Physically Abused: No    Sexually Abused: No     Past Surgical History:  Procedure Laterality Date   GOLD SEED IMPLANT N/A 12/29/2021   Procedure: GOLD SEED IMPLANT;  Surgeon: Crista Elliot, MD;  Location: [facility] ;  Service: Urology;  Laterality: N/A;  30 MINS   PROSTATE SURGERY  11/2003   TUNA procedure by dr Franchot Erichsen   PROSTATECTOMY  04/22/2004   @[facility]  by dr Volney American;  OPEN SIMPLE PROSTATECTOMY FOR BPH WITH CHRONIC URINARY RETENTION   SPACE OAR INSTILLATION N/A 12/29/2021   Procedure: SPACE OAR INSTILLATION;  Surgeon: Crista Elliot, MD;  Location: [facility];  Service: Urology;  Laterality: N/A;    Family History  Problem Relation Age of Onset   Hypertension Mother        parent   Hypertension Maternal Grandfather        grandparent    Colon cancer Neg Hx    Colon polyps Neg Hx    Esophageal cancer Neg Hx    Rectal cancer Neg Hx    Stomach cancer Neg Hx     No Known Allergies  Current Outpatient Medications on File Prior to Visit  Medication Sig Dispense Refill   amLODipine (NORVASC) 5 MG tablet TAKE 1 TABLET(5 MG) BY MOUTH DAILY 90 tablet 3   lisinopril-hydrochlorothiazide (ZESTORETIC) 20-25 MG tablet TAKE 1 TABLET BY MOUTH DAILY 90 tablet 3   simvastatin (ZOCOR) 20 MG tablet TAKE 1 TABLET(20 MG) BY MOUTH DAILY 90 tablet 1   mirabegron ER (MYRBETRIQ) 25 MG TB24 tablet Take 25 mg by mouth daily. (Patient not taking: Reported on 01/19/2023)     silodosin (RAPAFLO) 8 MG CAPS capsule Take 8 mg by mouth daily with breakfast. (Patient not taking: Reported on 01/19/2023)     No current facility-administered medications on file prior to visit.    BP (!) 150/80   Pulse 63   Temp 98.3 F (36.8 C) (Oral)   Ht 5\' 4"  (1.626 m)   Wt 202 lb (91.6 kg)   SpO2 98%   BMI 34.67 kg/m       Objective:   Physical Exam Vitals and nursing note reviewed.  Constitutional:      Appearance: Normal appearance.  Cardiovascular:     Rate and Rhythm: Normal rate.  Musculoskeletal:     Right  hand: Swelling and bony tenderness present. No deformity. Decreased range of motion. Decreased strength. Normal sensation. There is no disruption of two-point discrimination. Normal capillary refill. Normal pulse.     Left hand: Swelling and bony tenderness present. No deformity. Decreased range of motion. Decreased strength. Normal sensation. There is no disruption of two-point discrimination. Normal capillary refill. Normal pulse.  Neurological:  Mental Status: He is alert.       Assessment & Plan:  1. Primary osteoarthritis of both hands - Will get xrays of hands today - He would like to try Meloxicam before going to see a hand specialist.  - Will have him follow up in 30 days  - DG Hand Complete Right; Future - DG Hand Complete Left; Future - meloxicam (MOBIC) 7.5 MG tablet; Take 1 tablet (7.5 mg total) by mouth daily.  Dispense: 90 tablet; Refill: 0  2. Essential hypertension - elevated today but he did not take his medications this morning   Shirline Frees, NP

## 2023-02-22 ENCOUNTER — Ambulatory Visit: Payer: Medicare Other | Admitting: Adult Health

## 2023-02-22 ENCOUNTER — Encounter: Payer: Self-pay | Admitting: Adult Health

## 2023-02-22 VITALS — BP 132/80 | HR 60 | Temp 98.2°F | Ht 64.0 in | Wt 202.0 lb

## 2023-02-22 DIAGNOSIS — M79641 Pain in right hand: Secondary | ICD-10-CM

## 2023-02-22 DIAGNOSIS — M79642 Pain in left hand: Secondary | ICD-10-CM

## 2023-02-22 NOTE — Progress Notes (Signed)
Subjective:    Patient ID: John Stone, male    DOB: 10/05/1950, 72 y.o.   MRN: [id number]  HPI  72 year old male who  has a past medical history of Arthritis, BPH with obstruction/lower urinary tract symptoms, History of chronic prostatitis (2005), History of urinary retention, Hyperlipemia, mixed, Hypertension, Metastatic castration-sensitive adenocarcinoma of prostate (HCC) (08/2021), Pre-diabetes, and Wears dentures.  He presents to the office today for follow-up regarding bilateral hand pain.  When he was seen previously he reported that he felt as though the joints in both hands were stiff in the morning.  The stiffness improves throughout the day.  He did not have any swelling, redness, or warmth.  He tried using Voltaren gel but this did not work well.  He had a hard time opening jars due to decreased grip strength.   He was seen a month ago we started him on meloxicam 7.5 mg daily and did bilateral hand x-rays which showed  Right Hand IMPRESSION: Moderate index finger DIP, mild-to-moderate thumb interphalangeal, and moderate thumb carpometacarpal osteoarthritis.  Left Hand  IMPRESSION: Moderate second and third DIP, moderate thumb interphalangeal, and moderate thumb carpometacarpal osteoarthritis.  He wanted to wait 30 days and see if the meloxicam helped before going to see a hand specialist.Today he reports that the meloxicam did not help at all. His hands still feel stiff in the morning and improves to some degree throughout the day. He would like to see a hand specialist.    Review of Systems See HPI   Past Medical History:  Diagnosis Date   Arthritis    BPH with obstruction/lower urinary tract symptoms    History of chronic prostatitis 2005   post op prostate bx's   History of urinary retention    Hyperlipemia, mixed    Hypertension    Metastatic castration-sensitive adenocarcinoma of prostate (HCC) 08/2021   urologist--- dr newsome/  oncologist--- dr  Clelia Croft;  dx 01/ 2023,  gleason 8, PSA 60.7,  w/ pelvic adenopathy include left para-aortic lymph node   Pre-diabetes    Wears dentures    upper    Social History   Socioeconomic History   Marital status: Married    Spouse name: Not on file   Number of children: Not on file   Years of education: Not on file   Highest education level: Not on file  Occupational History   Not on file  Tobacco Use   Smoking status: Never   Smokeless tobacco: Never  Vaping Use   Vaping Use: Never used  Substance and Sexual Activity   Alcohol use: No   Drug use: No   Sexual activity: Not on file  Other Topics Concern   Not on file  Social History Narrative   Married   8 kids    6 live locally.       Work in [employer]   He likes to stay active in [employer].    Social Determinants of Health   Financial Resource Strain: Low Risk  (12/28/2022)   Overall Financial Resource Strain (CARDIA)    Difficulty of Paying Living Expenses: Not hard at all  Food Insecurity: No Food Insecurity (12/28/2022)   Hunger Vital Sign    Worried About Running Out of Food in the Last Year: Never true    Ran Out of Food in the Last Year: Never true  Transportation Needs: No Transportation Needs (12/28/2022)   PRAPARE - Administrator, Civil Service (  Medical): No    Lack of Transportation (Non-Medical): No  Physical Activity: Insufficiently Active (12/28/2022)   Exercise Vital Sign    Days of Exercise per Week: 3 days    Minutes of Exercise per Session: 10 min  Stress: No Stress Concern Present (12/28/2022)   [employer] of Occupational Health - Occupational Stress Questionnaire    Feeling of Stress : Not at all  Social Connections: Socially Integrated (12/28/2022)   Social Connection and Isolation Panel [NHANES]    Frequency of Communication with Friends and Family: More than three times a week    Frequency of Social Gatherings with Friends and Family: More than three times a week     Attends Religious Services: More than 4 times per year    Active Member of Clubs or Organizations: Yes    Attends Banker Meetings: More than 4 times per year    Marital Status: Married  Catering manager Violence: Not At Risk (12/28/2022)   Humiliation, Afraid, Rape, and Kick questionnaire    Fear of Current or Ex-Partner: No    Emotionally Abused: No    Physically Abused: No    Sexually Abused: No    Past Surgical History:  Procedure Laterality Date   GOLD SEED IMPLANT N/A 12/29/2021   Procedure: GOLD SEED IMPLANT;  Surgeon: Crista Elliot, MD;  Location: [facility] [place];  Service: Urology;  Laterality: N/A;  30 MINS   PROSTATE SURGERY  11/2003   TUNA procedure by dr Franchot Erichsen   PROSTATECTOMY  04/22/2004   @[facility]  by dr Volney American;  OPEN SIMPLE PROSTATECTOMY FOR BPH WITH CHRONIC URINARY RETENTION   SPACE OAR INSTILLATION N/A 12/29/2021   Procedure: SPACE OAR INSTILLATION;  Surgeon: Crista Elliot, MD;  Location: [facility];  Service: Urology;  Laterality: N/A;    Family History  Problem Relation Age of Onset   Hypertension Mother        parent   Hypertension Maternal Grandfather        grandparent    Colon cancer Neg Hx    Colon polyps Neg Hx    Esophageal cancer Neg Hx    Rectal cancer Neg Hx    Stomach cancer Neg Hx     No Known Allergies  Current Outpatient Medications on File Prior to Visit  Medication Sig Dispense Refill   amLODipine (NORVASC) 5 MG tablet TAKE 1 TABLET(5 MG) BY MOUTH DAILY 90 tablet 3   lisinopril-hydrochlorothiazide (ZESTORETIC) 20-25 MG tablet TAKE 1 TABLET BY MOUTH DAILY 90 tablet 3   meloxicam (MOBIC) 7.5 MG tablet Take 1 tablet (7.5 mg total) by mouth daily. 90 tablet 0   mirabegron ER (MYRBETRIQ) 25 MG TB24 tablet Take 25 mg by mouth daily.     silodosin (RAPAFLO) 8 MG CAPS capsule Take 8 mg by mouth daily with breakfast.     simvastatin (ZOCOR) 20 MG tablet TAKE 1 TABLET(20 MG) BY MOUTH DAILY 90  tablet 1   No current facility-administered medications on file prior to visit.    BP 132/80   Pulse 60   Temp 98.2 F (36.8 C) (Oral)   Ht 5\' 4"  (1.626 m)   Wt 202 lb (91.6 kg)   SpO2 98%   BMI 34.67 kg/m       Objective:   Physical Exam Vitals and nursing note reviewed.  Constitutional:      Appearance: Normal appearance.  Cardiovascular:     Rate and Rhythm: Normal rate and regular  rhythm.     Pulses: Normal pulses.     Heart sounds: Normal heart sounds.  Pulmonary:     Effort: Pulmonary effort is normal.     Breath sounds: Normal breath sounds.  Musculoskeletal:        General: Swelling and tenderness present. No deformity.  Skin:    General: Skin is warm and dry.  Neurological:     General: No focal deficit present.     Mental Status: He is alert and oriented to person, place, and time.  Psychiatric:        Mood and Affect: Mood normal.        Behavior: Behavior normal.        Thought Content: Thought content normal.        Judgment: Judgment normal.       Assessment & Plan:  1. Pain in both hands  - Ambulatory referral to Hand Surgery  Shirline Frees, NP

## 2023-03-02 DIAGNOSIS — M79641 Pain in right hand: Secondary | ICD-10-CM | POA: Diagnosis not present

## 2023-03-02 DIAGNOSIS — R200 Anesthesia of skin: Secondary | ICD-10-CM | POA: Diagnosis not present

## 2023-03-02 DIAGNOSIS — M79642 Pain in left hand: Secondary | ICD-10-CM | POA: Diagnosis not present

## 2023-03-23 DIAGNOSIS — G5603 Carpal tunnel syndrome, bilateral upper limbs: Secondary | ICD-10-CM | POA: Diagnosis not present

## 2023-05-03 DIAGNOSIS — G5601 Carpal tunnel syndrome, right upper limb: Secondary | ICD-10-CM | POA: Diagnosis not present

## 2023-05-15 DIAGNOSIS — R3914 Feeling of incomplete bladder emptying: Secondary | ICD-10-CM | POA: Diagnosis not present

## 2023-05-17 ENCOUNTER — Other Ambulatory Visit: Payer: Self-pay | Admitting: Adult Health

## 2023-05-17 DIAGNOSIS — M19041 Primary osteoarthritis, right hand: Secondary | ICD-10-CM

## 2023-05-17 NOTE — Telephone Encounter (Signed)
  The original prescription was discontinued on 02/22/2023 by Shirline Frees, NP. Renewing this prescription may not be appropriate.

## 2023-06-01 ENCOUNTER — Other Ambulatory Visit: Payer: Self-pay | Admitting: Urology

## 2023-06-01 DIAGNOSIS — C61 Malignant neoplasm of prostate: Secondary | ICD-10-CM

## 2023-06-12 DIAGNOSIS — G5601 Carpal tunnel syndrome, right upper limb: Secondary | ICD-10-CM | POA: Diagnosis not present

## 2023-06-26 DIAGNOSIS — G5602 Carpal tunnel syndrome, left upper limb: Secondary | ICD-10-CM | POA: Diagnosis not present

## 2023-06-29 ENCOUNTER — Other Ambulatory Visit: Payer: Self-pay | Admitting: Adult Health

## 2023-09-25 DIAGNOSIS — I700 Atherosclerosis of aorta: Secondary | ICD-10-CM | POA: Diagnosis not present

## 2023-09-25 DIAGNOSIS — Z136 Encounter for screening for cardiovascular disorders: Secondary | ICD-10-CM | POA: Diagnosis not present

## 2023-09-25 DIAGNOSIS — I10 Essential (primary) hypertension: Secondary | ICD-10-CM | POA: Diagnosis not present

## 2023-09-25 DIAGNOSIS — Z0189 Encounter for other specified special examinations: Secondary | ICD-10-CM | POA: Diagnosis not present

## 2023-09-25 DIAGNOSIS — R7309 Other abnormal glucose: Secondary | ICD-10-CM | POA: Diagnosis not present

## 2023-09-25 DIAGNOSIS — E559 Vitamin D deficiency, unspecified: Secondary | ICD-10-CM | POA: Diagnosis not present

## 2023-09-25 DIAGNOSIS — Z79899 Other long term (current) drug therapy: Secondary | ICD-10-CM | POA: Diagnosis not present

## 2023-09-25 DIAGNOSIS — C775 Secondary and unspecified malignant neoplasm of intrapelvic lymph nodes: Secondary | ICD-10-CM | POA: Diagnosis not present

## 2023-10-20 ENCOUNTER — Other Ambulatory Visit: Payer: Self-pay | Admitting: Adult Health

## 2023-10-20 DIAGNOSIS — I10 Essential (primary) hypertension: Secondary | ICD-10-CM

## 2023-10-31 LAB — COLOGUARD: COLOGUARD: NEGATIVE

## 2023-10-31 LAB — EXTERNAL GENERIC LAB PROCEDURE: COLOGUARD: NEGATIVE

## 2024-01-10 ENCOUNTER — Ambulatory Visit
Admission: RE | Admit: 2024-01-10 | Discharge: 2024-01-10 | Disposition: A | Discharge status: Home / Self Care | Payer: Medicare Other | Source: Ambulatory Visit | Attending: Urology

## 2024-01-10 DIAGNOSIS — C61 Malignant neoplasm of prostate: Secondary | ICD-10-CM

## 2024-01-21 ENCOUNTER — Other Ambulatory Visit: Payer: Self-pay | Admitting: Adult Health

## 2024-01-24 ENCOUNTER — Other Ambulatory Visit: Payer: Self-pay | Admitting: Adult Health

## 2024-05-16 ENCOUNTER — Other Ambulatory Visit: Payer: Self-pay | Admitting: Family Medicine

## 2024-05-16 ENCOUNTER — Ambulatory Visit
Admission: RE | Admit: 2024-05-16 | Discharge: 2024-05-16 | Disposition: A | Discharge status: Home / Self Care | Source: Ambulatory Visit | Attending: Family Medicine | Admitting: Family Medicine

## 2024-05-16 DIAGNOSIS — R109 Unspecified abdominal pain: Secondary | ICD-10-CM

## 2024-05-22 ENCOUNTER — Ambulatory Visit

## 2024-05-23 ENCOUNTER — Ambulatory Visit (INDEPENDENT_AMBULATORY_CARE_PROVIDER_SITE_OTHER)

## 2024-05-23 VITALS — Ht 64.0 in | Wt 204.0 lb

## 2024-05-23 DIAGNOSIS — Z0000 Encounter for general adult medical examination without abnormal findings: Secondary | ICD-10-CM | POA: Diagnosis not present

## 2024-05-23 NOTE — Patient Instructions (Addendum)
 John Stone,  Thank you for taking the time for your Medicare Wellness Visit. I appreciate your continued commitment to your health goals. Please review the care plan we discussed, and feel free to reach out if I can assist you further.  Medicare recommends these wellness visits once per year to help you and your care team stay ahead of potential health issues. These visits are designed to focus on prevention, allowing your provider to concentrate on managing your acute and chronic conditions during your regular appointments.  Please note that Annual Wellness Visits do not include a physical exam. Some assessments may be limited, especially if the visit was conducted virtually. If needed, we may recommend a separate in-person follow-up with your provider.  Ongoing Care Seeing your primary care provider every 3 to 6 months helps us  monitor your health and provide consistent, personalized care.   Referrals If a referral was made during today's visit and you haven't received any updates within two weeks, please contact the referred provider directly to check on the status.  Recommended Screenings:  Health Maintenance  Topic Date Due   DTaP/Tdap/Td vaccine (1 - Tdap) Never done   Zoster (Shingles) Vaccine (1 of 2) Never done   COVID-19 Vaccine (3 - Pfizer risk series) 12/16/2019   Flu Shot  03/15/2024   Medicare Annual Wellness Visit  05/23/2025   Cologuard (Stool DNA test)  10/25/2026   Pneumococcal Vaccine for age over 99  Completed   Hepatitis C Screening  Completed   Meningitis B Vaccine  Aged Out       05/23/2024    9:04 AM  Advanced Directives  Does Patient Have a Medical Advance Directive? No  Would patient like information on creating a medical advance directive? No - Patient declined   Advance Care Planning is important because it: Ensures you receive medical care that aligns with your values, goals, and preferences. Provides guidance to your family and loved ones, reducing  the emotional burden of decision-making during critical moments.  Vision: Annual vision screenings are recommended for early detection of glaucoma, cataracts, and diabetic retinopathy. These exams can also reveal signs of chronic conditions such as diabetes and high blood pressure.  Dental: Annual dental screenings help detect early signs of oral cancer, gum disease, and other conditions linked to overall health, including heart disease and diabetes.  Please see the attached documents for additional preventive care recommendations.

## 2024-05-23 NOTE — Progress Notes (Signed)
 Subjective:   John Stone is a 73 y.o. who presents for a Medicare Wellness preventive visit.  As a reminder, Annual Wellness Visits don't include a physical exam, and some assessments may be limited, especially if this visit is performed virtually. We may recommend an in-person follow-up visit with your provider if needed.  Visit Complete: Virtual I connected with  John Stone on 05/23/24 by a audio enabled telemedicine application and verified that I am speaking with the correct person using two identifiers.  Patient Location: Home  Provider Location: Home Office  I discussed the limitations of evaluation and management by telemedicine. The patient expressed understanding and agreed to proceed.  Vital Signs: Because this visit was a virtual/telehealth visit, some criteria may be missing or patient reported. Any vitals not documented were not able to be obtained and vitals that have been documented are patient reported.    Persons Participating in Visit: Patient.  AWV Questionnaire: No: Patient Medicare AWV questionnaire was not completed prior to this visit.  Cardiac Risk Factors include: advanced age (>5men, >64 women);male gender;hypertension     Objective:    Today's Vitals   05/23/24 0857  Weight: 204 lb (92.5 kg)  Height: 5' 4 (1.626 m)   Body mass index is 35.02 kg/m.     05/23/2024    9:04 AM 12/28/2022    8:35 AM 05/10/2022    2:34 PM 12/29/2021    7:57 AM 12/23/2021    8:37 AM 09/30/2020    9:27 AM 10/11/2017   10:18 AM  Advanced Directives  Does Patient Have a Medical Advance Directive? No No No No No No No   Would patient like information on creating a medical advance directive? No - Patient declined No - Patient declined  No - Patient declined No - Patient declined No - Patient declined      Data saved with a previous flowsheet row definition    Current Medications (verified) Outpatient Encounter Medications as of 05/23/2024   Medication Sig   amLODipine  (NORVASC ) 5 MG tablet TAKE 1 TABLET(5 MG) BY MOUTH DAILY   lisinopril -hydrochlorothiazide  (ZESTORETIC ) 20-25 MG tablet TAKE 1 TABLET BY MOUTH DAILY   silodosin (RAPAFLO) 8 MG CAPS capsule Take 8 mg by mouth daily with breakfast.   simvastatin  (ZOCOR ) 20 MG tablet TAKE 1 TABLET(20 MG) BY MOUTH DAILY AT 6 PM   No facility-administered encounter medications on file as of 05/23/2024.    Allergies (verified) Patient has no known allergies.   History: Past Medical History:  Diagnosis Date   Arthritis    BPH with obstruction/lower urinary tract symptoms    History of chronic prostatitis 2005   post op prostate bx's   History of urinary retention    Hyperlipemia, mixed    Hypertension    Metastatic castration-sensitive adenocarcinoma of prostate (HCC) 08/2021   urologist--- dr newsome/  oncologist--- dr amadeo;  dx 01/ 2023,  gleason 8, PSA 60.7,  w/ pelvic adenopathy include left para-aortic lymph node   Pre-diabetes    Wears dentures    upper   Past Surgical History:  Procedure Laterality Date   GOLD SEED IMPLANT N/A 12/29/2021   Procedure: GOLD SEED IMPLANT;  Surgeon: Carolee Sherwood JONETTA DOUGLAS, MD;  Location: [facility];  Service: Urology;  Laterality: N/A;  30 MINS   PROSTATE SURGERY  11/2003   TUNA procedure by dr tennie   PROSTATECTOMY  04/22/2004   @[facility]  by dr deitra;  OPEN SIMPLE PROSTATECTOMY FOR BPH  WITH CHRONIC URINARY RETENTION   SPACE OAR INSTILLATION N/A 12/29/2021   Procedure: SPACE OAR INSTILLATION;  Surgeon: Carolee Sherwood JONETTA DOUGLAS, MD;  Location: [facility];  Service: Urology;  Laterality: N/A;   Family History  Problem Relation Age of Onset   Hypertension Mother        parent   Hypertension Maternal Grandfather        grandparent    Colon cancer Neg Hx    Colon polyps Neg Hx    Esophageal cancer Neg Hx    Rectal cancer Neg Hx    Stomach cancer Neg Hx    Social History   Socioeconomic History   Marital  status: Married    Spouse name: Not on file   Number of children: Not on file   Years of education: Not on file   Highest education level: Not on file  Occupational History   Not on file  Tobacco Use   Smoking status: Never   Smokeless tobacco: Never  Vaping Use   Vaping status: Never Used  Substance and Sexual Activity   Alcohol use: No   Drug use: No   Sexual activity: Not on file  Other Topics Concern   Not on file  Social History Narrative   Married   8 kids    6 live locally.       Work in [employer]   He likes to stay active in [employer].    Social Drivers of Corporate investment banker Strain: Low Risk  (05/23/2024)   Overall Financial Resource Strain (CARDIA)    Difficulty of Paying Living Expenses: Not hard at all  Food Insecurity: No Food Insecurity (05/23/2024)   Hunger Vital Sign    Worried About Running Out of Food in the Last Year: Never true    Ran Out of Food in the Last Year: Never true  Transportation Needs: No Transportation Needs (05/23/2024)   PRAPARE - Administrator, Civil Service (Medical): No    Lack of Transportation (Non-Medical): No  Physical Activity: Inactive (05/23/2024)   Exercise Vital Sign    Days of Exercise per Week: 0 days    Minutes of Exercise per Session: 0 min  Stress: No Stress Concern Present (05/23/2024)   [employer] of Occupational Health - Occupational Stress Questionnaire    Feeling of Stress: Not at all  Social Connections: Socially Integrated (05/23/2024)   Social Connection and Isolation Panel    Frequency of Communication with Friends and Family: More than three times a week    Frequency of Social Gatherings with Friends and Family: More than three times a week    Attends Religious Services: More than 4 times per year    Active Member of [employer] or Organizations: Yes    Attends Engineer, structural: More than 4 times per year    Marital Status: Married    Tobacco  Counseling Counseling given: Not Answered    Clinical Intake:  Pre-visit preparation completed: Yes  Pain : No/denies pain     BMI - recorded: 35.02 Nutritional Status: BMI > 30  Obese Nutritional Risks: None Diabetes: No  Lab Results  Component Value Date   HGBA1C 6.2 11/22/2022   HGBA1C 5.7 05/17/2022   HGBA1C 6.6 (H) 11/16/2021     How often do you need to have someone help you when you read instructions, pamphlets, or other written materials from your doctor or pharmacy?: 1 - Never  Interpreter  Needed?: No  Information entered by :: Rojelio Blush LPN   Activities of Daily Living     05/23/2024    9:03 AM  In your present state of health, do you have any difficulty performing the following activities:  Hearing? 0  Vision? 0  Difficulty concentrating or making decisions? 0  Walking or climbing stairs? 0  Dressing or bathing? 0  Doing errands, shopping? 0  Preparing Food and eating ? N  Using the Toilet? N  In the past six months, have you accidently leaked urine? N  Do you have problems with loss of bowel control? N  Managing your Medications? N  Managing your Finances? N  Housekeeping or managing your Housekeeping? N    Patient Care Team: Merna Huxley, NP as PCP - General (Family Medicine) Ottelin, Mark, MD (Inactive) as Consulting Physician (Urology) [facility] (Specialist)  I have updated your Care Teams any recent Medical Services you may have received from other providers in the past year.     Assessment:   This is a routine wellness examination for [place].  Hearing/Vision screen Hearing Screening - Comments:: Denies hearing difficulties   Vision Screening - Comments:: Wears reading glasses -Not up to date with routine eye exams     Goals Addressed               This Visit's Progress     Increase physical activity (pt-stated)        Get more active.       Depression Screen     05/23/2024    9:02 AM 12/28/2022    8:32 AM  11/22/2022    8:06 AM 07/28/2022    2:49 PM 05/17/2022   11:40 AM 12/23/2021    8:30 AM 11/11/2021    7:34 AM  PHQ 2/9 Scores  PHQ - 2 Score 0 0 0 0 0 0 1  PHQ- 9 Score  0 0 2 3 0 2    Fall Risk     05/23/2024    9:03 AM 12/28/2022    8:34 AM 11/22/2022    8:06 AM 05/17/2022   11:39 AM 12/23/2021    8:35 AM  Fall Risk   Falls in the past year? 0 0 0 1 1  Number falls in past yr: 0 0 0 1 0  Injury with Fall? 0 0 0 0 0  Comment     No injury or medical attention needed  Risk for fall due to : No Fall Risks No Fall Risks No Fall Risks Other (Comment) No Fall Risks  Follow up Falls evaluation completed Falls prevention discussed Falls evaluation completed Falls evaluation completed       Data saved with a previous flowsheet row definition    MEDICARE RISK AT HOME:  Medicare Risk at Home Any stairs in or around the home?: Yes If so, are there any without handrails?: No Home free of loose throw rugs in walkways, pet beds, electrical cords, etc?: Yes Adequate lighting in your home to reduce risk of falls?: Yes Life alert?: No Use of a cane, walker or w/c?: No Grab bars in the bathroom?: No Shower chair or bench in shower?: No Elevated toilet seat or a handicapped toilet?: No  TIMED UP AND GO:  Was the test performed?  No  Cognitive Function: 6CIT completed    10/11/2017   10:24 AM  MMSE - Mini Mental State Exam  Not completed: --      Significant value  05/23/2024    9:04 AM 12/28/2022    8:35 AM 12/23/2021    8:37 AM  6CIT Screen  What Year? 0 points 0 points 0 points  What month? 0 points 0 points 0 points  What time? 0 points 0 points 0 points  Count back from 20 0 points 0 points 0 points  Months in reverse 0 points 0 points 0 points  Repeat phrase 0 points 0 points 0 points  Total Score 0 points 0 points 0 points    Immunizations Immunization History  Administered Date(s) Administered   Fluad Quad(high Dose 65+) 05/07/2019, 06/09/2020, 05/26/2021,  05/17/2022   INFLUENZA, HIGH DOSE SEASONAL PF 09/28/2017, 06/12/2018   PFIZER(Purple Top)SARS-COV-2 Vaccination 10/24/2019, 11/18/2019   Pneumococcal Conjugate-13 10/16/2018   Pneumococcal Polysaccharide-23 06/09/2020    Screening Tests Health Maintenance  Topic Date Due   DTaP/Tdap/Td (1 - Tdap) Never done   Zoster Vaccines- Shingrix (1 of 2) Never done   COVID-19 Vaccine (3 - Pfizer risk series) 12/16/2019   Influenza Vaccine  03/15/2024   Medicare Annual Wellness (AWV)  05/23/2025   Fecal DNA (Cologuard)  10/25/2026   Pneumococcal Vaccine: 50+ Years  Completed   Hepatitis C Screening  Completed   Meningococcal B Vaccine  Aged Out    Health Maintenance Items Addressed:   Additional Screening:  Vision Screening: Recommended annual ophthalmology exams for early detection of glaucoma and other disorders of the eye. Is the patient up to date with their annual eye exam?  No  Who is the provider or what is the name of the office in which the patient attends annual eye exams? Deferred  Dental Screening: Recommended annual dental exams for proper oral hygiene  Community Resource Referral / Chronic Care Management: CRR required this visit?  No   CCM required this visit?  No   Plan:    I have personally reviewed and noted the following in the patient's chart:   Medical and social history Use of alcohol, tobacco or illicit drugs  Current medications and supplements including opioid prescriptions. Patient is not currently taking opioid prescriptions. Functional ability and status Nutritional status Physical activity Advanced directives List of other physicians Hospitalizations, surgeries, and ER visits in previous 12 months Vitals Screenings to include cognitive, depression, and falls Referrals and appointments  In addition, I have reviewed and discussed with patient certain preventive protocols, quality metrics, and best practice recommendations. A written personalized  care plan for preventive services as well as general preventive health recommendations were provided to patient.   Rojelio LELON Blush, LPN   [id number]   After Visit Summary: (MyChart) Due to this being a telephonic visit, the after visit summary with patients personalized plan was offered to patient via MyChart   Notes: Nothing significant to report at this time.

## 2025-05-30 ENCOUNTER — Ambulatory Visit
